# Patient Record
Sex: Male | Born: 1980 | Race: White | Hispanic: No | Marital: Married | Smoking: Current some day smoker
Health system: Southern US, Community
[De-identification: ages and names within clinical notes are randomized; demographics above are authoritative.]

## PROBLEM LIST (undated history)

## (undated) DIAGNOSIS — T7840XA Allergy, unspecified, initial encounter: Secondary | ICD-10-CM

## (undated) HISTORY — DX: Allergy, unspecified, initial encounter: T78.40XA

---

## 2007-11-03 ENCOUNTER — Ambulatory Visit: Payer: Self-pay | Admitting: Internal Medicine

## 2007-11-03 LAB — CONVERTED CEMR LAB
Bilirubin Urine: NEGATIVE
Glucose, Urine, Semiquant: NEGATIVE
Protein, U semiquant: NEGATIVE
Urobilinogen, UA: NEGATIVE
WBC Urine, dipstick: NEGATIVE

## 2007-11-05 LAB — CONVERTED CEMR LAB
ALT: 16 units/L (ref 0–53)
AST: 22 units/L (ref 0–37)
BUN: 17 mg/dL (ref 6–23)
Calcium: 9.7 mg/dL (ref 8.4–10.5)
Chloride: 103 meq/L (ref 96–112)
Cholesterol: 180 mg/dL (ref 0–200)
GFR calc Af Amer: 105 mL/min
GFR calc non Af Amer: 87 mL/min
LDL Cholesterol: 109 mg/dL — ABNORMAL HIGH (ref 0–99)
TSH: 1.24 microintl units/mL (ref 0.35–5.50)
Total CHOL/HDL Ratio: 3.7
VLDL: 22 mg/dL (ref 0–40)

## 2007-11-11 ENCOUNTER — Telehealth: Payer: Self-pay | Admitting: Internal Medicine

## 2007-11-16 ENCOUNTER — Encounter (INDEPENDENT_AMBULATORY_CARE_PROVIDER_SITE_OTHER): Payer: Self-pay | Admitting: *Deleted

## 2009-02-21 ENCOUNTER — Emergency Department (HOSPITAL_COMMUNITY): Admission: EM | Admit: 2009-02-21 | Discharge: 2009-02-21 | Payer: Self-pay | Admitting: Emergency Medicine

## 2009-02-23 ENCOUNTER — Encounter: Payer: Self-pay | Admitting: Internal Medicine

## 2009-03-02 ENCOUNTER — Ambulatory Visit (HOSPITAL_COMMUNITY): Admission: RE | Admit: 2009-03-02 | Discharge: 2009-03-03 | Payer: Self-pay | Admitting: Orthopedic Surgery

## 2009-03-02 HISTORY — PX: ORIF CLAVICLE FRACTURE: SUR924

## 2011-02-06 ENCOUNTER — Telehealth: Payer: Self-pay | Admitting: Internal Medicine

## 2011-02-12 NOTE — Progress Notes (Signed)
Summary: Paperwork --Needs CPX  Phone Note Other Incoming   Summary of Call: The office received Conceal and Carry paperwork from [facility]. Per MD this cannot be done because the patient has not been seen since 2008. Called patient  to discuss, left message on machine to call back to office. Lucious Groves CMA  February 06, 2011 3:51 PM  Initial call taken by: Lucious Groves CMA,  February 06, 2011 3:50 PM  Follow-up for Phone Call        Patient notified and will come in for CPX. Lucious Groves CMA  February 07, 2011 8:35 AM

## 2011-02-21 ENCOUNTER — Encounter (INDEPENDENT_AMBULATORY_CARE_PROVIDER_SITE_OTHER): Payer: BC Managed Care – PPO | Admitting: Internal Medicine

## 2011-02-21 ENCOUNTER — Encounter: Payer: Self-pay | Admitting: Internal Medicine

## 2011-02-21 DIAGNOSIS — M549 Dorsalgia, unspecified: Secondary | ICD-10-CM | POA: Insufficient documentation

## 2011-02-21 DIAGNOSIS — Z0000 Encounter for general adult medical examination without abnormal findings: Secondary | ICD-10-CM

## 2011-02-21 DIAGNOSIS — Z23 Encounter for immunization: Secondary | ICD-10-CM

## 2011-02-26 LAB — CONVERTED CEMR LAB
Alkaline Phosphatase: 63 units/L (ref 39–117)
BUN: 15 mg/dL (ref 6–23)
Basophils Absolute: 0 10*3/uL (ref 0.0–0.1)
Bilirubin, Direct: 0.2 mg/dL (ref 0.0–0.3)
Cholesterol: 159 mg/dL (ref 0–200)
Creatinine, Ser: 0.99 mg/dL (ref 0.40–1.50)
Eosinophils Relative: 2 % (ref 0–5)
Glucose, Bld: 73 mg/dL (ref 70–99)
HCT: 39.9 % (ref 39.0–52.0)
Indirect Bilirubin: 0.9 mg/dL (ref 0.0–0.9)
LDL Cholesterol: 82 mg/dL (ref 0–99)
Lymphocytes Relative: 35 % (ref 12–46)
Lymphs Abs: 2.3 10*3/uL (ref 0.7–4.0)
Neutro Abs: 3.7 10*3/uL (ref 1.7–7.7)
Neutrophils Relative %: 56 % (ref 43–77)
Platelets: 191 10*3/uL (ref 150–400)
Potassium: 3.7 meq/L (ref 3.5–5.3)
RDW: 12.8 % (ref 11.5–15.5)
Sodium: 139 meq/L (ref 135–145)
Total Protein: 6.8 g/dL (ref 6.0–8.3)
Triglycerides: 113 mg/dL (ref <150)
WBC: 6.6 10*3/uL (ref 4.0–10.5)
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
## 2011-04-03 LAB — PROTIME-INR
INR: 1 (ref 0.00–1.49)
Prothrombin Time: 12.9 seconds (ref 11.6–15.2)

## 2011-04-03 LAB — COMPREHENSIVE METABOLIC PANEL
BUN: 17 mg/dL (ref 6–23)
CO2: 28 mEq/L (ref 19–32)
Calcium: 9.8 mg/dL (ref 8.4–10.5)
Creatinine, Ser: 0.94 mg/dL (ref 0.4–1.5)
GFR calc non Af Amer: >60 mL/min (ref >60)
Glucose, Bld: 99 mg/dL (ref 70–99)
Sodium: 140 mEq/L (ref 135–145)
Total Protein: 6.9 g/dL (ref 6.0–8.3)

## 2011-04-03 LAB — APTT: aPTT: 28 seconds (ref 24–37)

## 2011-04-03 LAB — CBC
Hemoglobin: 14.8 g/dL (ref 13.0–17.0)
MCHC: 35.9 g/dL (ref 30.0–36.0)
MCV: 90.3 fL (ref 78.0–100.0)
RDW: 12.7 % (ref 11.5–15.5)
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
## 2011-08-19 ENCOUNTER — Ambulatory Visit: Payer: BC Managed Care – PPO | Admitting: Internal Medicine

## 2015-10-23 HISTORY — PX: VASECTOMY: SHX75

## 2015-11-21 ENCOUNTER — Emergency Department (HOSPITAL_COMMUNITY)
Admission: EM | Admit: 2015-11-21 | Discharge: 2015-11-21 | Disposition: A | Discharge status: Home / Self Care | Payer: 59 | Source: Home / Self Care | Attending: Family Medicine | Admitting: Family Medicine

## 2015-11-21 ENCOUNTER — Encounter (HOSPITAL_COMMUNITY): Payer: Self-pay | Admitting: *Deleted

## 2015-11-21 ENCOUNTER — Emergency Department (INDEPENDENT_AMBULATORY_CARE_PROVIDER_SITE_OTHER): Payer: 59

## 2015-11-21 DIAGNOSIS — J069 Acute upper respiratory infection, unspecified: Secondary | ICD-10-CM | POA: Diagnosis not present

## 2015-11-21 MED ORDER — AZITHROMYCIN 250 MG PO TABS: ORAL_TABLET | ORAL | Status: DC

## 2015-11-21 MED ORDER — IPRATROPIUM BROMIDE 0.06 % NA SOLN: 2.0000 | Freq: Four times a day (QID) | NASAL | Status: DC

## 2015-11-21 MED ORDER — GUAIFENESIN-CODEINE 100-10 MG/5ML PO SYRP: 10.0000 mL | ORAL_SOLUTION | Freq: Four times a day (QID) | ORAL | Status: DC | PRN

## 2015-11-21 NOTE — ED Provider Notes (Signed)
CSN: [id number]     Arrival date & time 11/21/15  1741 History   First MD Initiated Contact with Patient 11/21/15 1823     Chief Complaint  Patient presents with  . URI   (Consider location/radiation/quality/duration/timing/severity/associated sxs/prior Treatment) Patient is a 34 y.o. male presenting with URI. The history is provided by the patient.  URI Presenting symptoms: congestion, cough, fever and rhinorrhea   Severity:  Moderate Onset quality:  Gradual Duration:  2 weeks Associated symptoms comment:  Phlegm esp in am. Risk factors: sick contacts   Risk factors comment:  Wife also with pna and child ill.   History reviewed. No pertinent past medical history. History reviewed. No pertinent past surgical history. History reviewed. No pertinent family history. Social History  Substance Use Topics  . Smoking status: None  . Smokeless tobacco: None  . Alcohol Use: No    Review of Systems  Constitutional: Positive for fever.  HENT: Positive for congestion and rhinorrhea.   Respiratory: Positive for cough.   Cardiovascular: Negative for palpitations and leg swelling.  All other systems reviewed and are negative.   Allergies  Review of patient's allergies indicates no known allergies.  Home Medications   Prior to Admission medications   Medication Sig Start Date End Date Taking? Authorizing Provider  azithromycin (ZITHROMAX Z-PAK) 250 MG tablet Take as directed on pack 11/21/15   Linna Hoff, MD  guaiFENesin-codeine [facility]) 100-10 MG/5ML syrup Take 10 mLs by mouth 4 (four) times daily as needed for cough. 11/21/15   Linna Hoff, MD  ipratropium (ATROVENT) 0.06 % nasal spray Place 2 sprays into both nostrils 4 (four) times daily. 11/21/15   Linna Hoff, MD   Meds Ordered and Administered this Visit  Medications - No data to display  BP 130/74 mmHg  Pulse 78  Temp(Src) 98.6 F (37 C) (Oral)  Resp 18  SpO2 100% No data found.   Physical Exam   Constitutional: He is oriented to person, place, and time. He appears well-developed and well-nourished. No distress.  HENT:  Head: Normocephalic.  Right Ear: External ear normal.  Left Ear: External ear normal.  Mouth/Throat: Oropharynx is clear and moist.  Eyes: Conjunctivae are normal. Pupils are equal, round, and reactive to light.  Neck: Normal range of motion. Neck supple.  Cardiovascular: Regular rhythm, normal heart sounds and intact distal pulses.   Pulmonary/Chest: Effort normal and breath sounds normal.  Lymphadenopathy:    He has no cervical adenopathy.  Neurological: He is alert and oriented to person, place, and time.  Skin: Skin is warm and dry.  Nursing note and vitals reviewed.   ED Course  Procedures (including critical care time)  Labs Review Labs Reviewed - No data to display  Imaging Review Dg Chest 2 View  11/21/2015  CLINICAL DATA:  Productive cough, congestion, sore throat EXAM: CHEST  2 VIEW COMPARISON:  None. FINDINGS: There is no focal parenchymal opacity. There is no pleural effusion or pneumothorax. The heart and mediastinal contours are unremarkable. There is a right midclavicular malleable plate with multiple interlocking screws transfixing a healed fracture. IMPRESSION: No active cardiopulmonary disease. Electronically Signed   By: Elige Ko   On: 11/21/2015 18:52   X-rays reviewed and report per radiologist.   Visual Acuity Review  Right Eye Distance:   Left Eye Distance:   Bilateral Distance:    Right Eye Near:   Left Eye Near:    Bilateral Near:  MDM   1. URI (upper respiratory infection)        Linna HoffJames D Kindl, MD 11/21/15 [phone]

## 2015-11-21 NOTE — Discharge Instructions (Signed)
Drink plenty of fluids as discussed, use medicine as prescribed, and mucinex or delsym for cough. Return or see your doctor if further problems °

## 2015-11-21 NOTE — ED Notes (Signed)
Pt  Reports           Wife   Has  pnuemonia       -  Pt  States      Cough  /  Congested     Symptoms  X  10  Days            Son   Has     Eye  Infection  l   Eye slightly  Puffy      Pt      Reports   Symptoms   Not  releived  By  [facility]  meds

## 2016-01-04 DIAGNOSIS — Z0000 Encounter for general adult medical examination without abnormal findings: Secondary | ICD-10-CM | POA: Diagnosis not present

## 2016-04-30 DIAGNOSIS — H5213 Myopia, bilateral: Secondary | ICD-10-CM | POA: Diagnosis not present

## 2016-06-30 DIAGNOSIS — H81399 Other peripheral vertigo, unspecified ear: Secondary | ICD-10-CM | POA: Diagnosis not present

## 2018-04-05 ENCOUNTER — Telehealth: Payer: Self-pay

## 2018-04-05 NOTE — Telephone Encounter (Signed)
Please schedule re-establish appt at Pt's convenience. Thank you.

## 2018-04-05 NOTE — Telephone Encounter (Signed)
Please advise 

## 2018-04-05 NOTE — Telephone Encounter (Signed)
Copied from CRM [phone]. Topic: Appointment Scheduling - Prior Auth Required for Appointment >> Apr 05, 2018 10:09 AM Terisa Starraylor, Brittany L wrote: Patient wants to re - est care with Dr Drue NovelPaz. I am not showing where he has seen Dr Drue NovelPaz at the [place] location. Please advise , call patient @ [phone].

## 2018-04-05 NOTE — Telephone Encounter (Signed)
Pt needed a cpe before June kset up cpe/reestablish care appt 05/18/18 @ 3pm.

## 2018-04-05 NOTE — Telephone Encounter (Signed)
Yes, please schedule a visit at his convenience, okay to do a new pt CPX

## 2018-04-05 NOTE — Telephone Encounter (Signed)
Noted  

## 2018-05-18 ENCOUNTER — Encounter: Payer: Self-pay | Admitting: Internal Medicine

## 2018-05-18 ENCOUNTER — Ambulatory Visit (INDEPENDENT_AMBULATORY_CARE_PROVIDER_SITE_OTHER): Payer: 59 | Admitting: Internal Medicine

## 2018-05-18 VITALS — BP 124/76 | HR 71 | Temp 98.1°F | Resp 16 | Ht 76.0 in | Wt 297.1 lb

## 2018-05-18 DIAGNOSIS — Z114 Encounter for screening for human immunodeficiency virus [HIV]: Secondary | ICD-10-CM

## 2018-05-18 DIAGNOSIS — Z0000 Encounter for general adult medical examination without abnormal findings: Secondary | ICD-10-CM | POA: Diagnosis not present

## 2018-05-18 NOTE — Progress Notes (Signed)
Subjective:    Patient ID: Wayne Hart, male    DOB: 11-17-1981, 37 y.o.   MRN: [id number]  DOS:  05/18/2018 Type of visit - description : CPX Interval history: Last visit was few years ago, since then he is doing well.  No major concerns, request a physical   Review of Systems  A 14 point review of systems is negative    Past Medical History:  Diagnosis Date  . Allergy    seasonal    Past Surgical History:  Procedure Laterality Date  . FRACTURE SURGERY Right 2010   clavicle fx  . VASECTOMY  10/2015    Social History   Socioeconomic History  . Marital status: Married    Spouse name: Not on file  . Number of children: 2  . Years of education: Not on file  . Highest education level: Not on file  Occupational History  . 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Banker  Social Needs  . Financial resource strain: Not on file  . Food insecurity:    Worry: Not on file    Inability: Not on file  . Transportation needs:    Medical: Not on file    Non-medical: Not on file  Tobacco Use  . Smoking status: Current Some Day Smoker    Types: Cigars  . Smokeless tobacco: Never Used  . Tobacco comment: occ cigars   Substance and Sexual Activity  . Alcohol use: No    Comment: socially   . Drug use: Not on file  . Sexual activity: Not on file  Lifestyle  . Physical activity:    Days per week: Not on file    Minutes per session: Not on file  . Stress: Not on file  Relationships  . Social connections:    Talks on phone: Not on file    Gets together: Not on file    Attends religious service: Not on file    Active member of club or organization: Not on file    Attends meetings of clubs or organizations: Not on file    Relationship status: Not on file  . Intimate partner violence:    Fear of current or ex partner: Not on file    Emotionally abused: Not on file    Physically abused: Not on file    Forced sexual activity: Not on file  Other Topics Concern  . Not on file    Social History Narrative  . Not on file     Family History  Problem Relation Age of Onset  . Myelodysplastic syndrome Father 80  . Diabetes Maternal Grandmother   . Colon cancer Neg Hx   . Prostate cancer Neg Hx   . CAD Neg Hx      Allergies as of 05/18/2018   No Known Allergies     Medication List    as of 05/18/2018 11:59 PM   You have not been prescribed any medications.        Objective:   Physical Exam BP 124/76 (BP Location: Left Arm, Patient Position: Sitting, Cuff Size: Small)   Pulse 71   Temp 98.1 F (36.7 C) (Oral)   Resp 16   Ht  (1.93 m)   Wt 297 lb 2 oz (134.8 kg)   SpO2 97%   BMI 36.17 kg/m  General:   Well developed, well nourished . NAD.  Neck: No  thyromegaly  HEENT:  Normocephalic . Face symmetric, atraumatic Lungs:  CTA B Normal respiratory effort,  no intercostal retractions, no accessory muscle use. Heart: RRR,  no murmur.  No pretibial edema bilaterally  Abdomen:  Not distended, soft, non-tender. No rebound or rigidity.   Skin: Exposed areas without rash. Not pale. Not jaundice Neurologic:  alert & oriented X3.  Speech normal, gait appropriate for age and unassisted Strength symmetric and appropriate for age.  Psych: Cognition and judgment appear intact.  Cooperative with normal attention span and concentration.  Behavior appropriate. No anxious or depressed appearing.     Assessment & Plan:   Assessment: Healthy 37 year old gentleman  Plan: Here for CPX, doing well, RTC 1 year

## 2018-05-18 NOTE — Assessment & Plan Note (Addendum)
Td 2012 No family history of prostate or colon cancer Diet and exercise: He is doing great with exercise, very active, plays racquetball and bicycling 3-4 times a week.  Room for improvement on diet.  Encourage calorie counting.  he would like to get down to 270 pounds. Labs: Will come back fasting for CMP, FLP, CBC, TSH, HIV.

## 2018-05-18 NOTE — Progress Notes (Signed)
Pre visit review using our clinic review tool, if applicable. No additional management support is needed unless otherwise documented below in the visit note. 

## 2018-05-18 NOTE — Patient Instructions (Addendum)
   GO TO THE FRONT DESK Schedule labs to be done within the next few days, fasting  Schedule your next appointment for a physical  exam in 1 year

## 2018-05-19 ENCOUNTER — Other Ambulatory Visit: Payer: 59

## 2018-05-20 ENCOUNTER — Other Ambulatory Visit (INDEPENDENT_AMBULATORY_CARE_PROVIDER_SITE_OTHER): Payer: 59

## 2018-05-20 DIAGNOSIS — Z0000 Encounter for general adult medical examination without abnormal findings: Secondary | ICD-10-CM

## 2018-05-20 DIAGNOSIS — Z114 Encounter for screening for human immunodeficiency virus [HIV]: Secondary | ICD-10-CM

## 2018-05-20 LAB — CBC WITH DIFFERENTIAL/PLATELET
Basophils Absolute: 0 10*3/uL (ref 0.0–0.1)
Basophils Relative: 0.4 % (ref 0.0–3.0)
EOS ABS: 0.1 10*3/uL (ref 0.0–0.7)
Eosinophils Relative: 2.4 % (ref 0.0–5.0)
HCT: 43.7 % (ref 39.0–52.0)
Hemoglobin: 15.5 g/dL (ref 13.0–17.0)
LYMPHS ABS: 1.6 10*3/uL (ref 0.7–4.0)
Lymphocytes Relative: 28.2 % (ref 12.0–46.0)
MCHC: 35.5 g/dL (ref 30.0–36.0)
MCV: 89.8 fl (ref 78.0–100.0)
MONO ABS: 0.4 10*3/uL (ref 0.1–1.0)
Monocytes Relative: 6.8 % (ref 3.0–12.0)
NEUTROS PCT: 62.2 % (ref 43.0–77.0)
Neutro Abs: 3.5 10*3/uL (ref 1.4–7.7)
Platelets: 216 10*3/uL (ref 150.0–400.0)
RBC: 4.86 Mil/uL (ref 4.22–5.81)
RDW: 13.2 % (ref 11.5–15.5)
WBC: 5.7 10*3/uL (ref 4.0–10.5)

## 2018-05-20 LAB — LIPID PANEL
CHOL/HDL RATIO: 4
CHOLESTEROL: 172 mg/dL (ref 0–200)
HDL: 47.9 mg/dL (ref >39.00)
NonHDL: 124.48
TRIGLYCERIDES: 212 mg/dL — AB (ref 0.0–149.0)
VLDL: 42.4 mg/dL — AB (ref 0.0–40.0)

## 2018-05-20 LAB — TSH: TSH: 1.39 u[IU]/mL (ref 0.35–4.50)

## 2018-05-20 LAB — COMPREHENSIVE METABOLIC PANEL
ALBUMIN: 4.3 g/dL (ref 3.5–5.2)
ALT: 20 U/L (ref 0–53)
AST: 16 U/L (ref 0–37)
Alkaline Phosphatase: 67 U/L (ref 39–117)
BUN: 14 mg/dL (ref 6–23)
CALCIUM: 9.3 mg/dL (ref 8.4–10.5)
CO2: 27 meq/L (ref 19–32)
Chloride: 102 mEq/L (ref 96–112)
Creatinine, Ser: 1.08 mg/dL (ref 0.40–1.50)
GFR: 82.25 mL/min (ref >60.00)
Glucose, Bld: 98 mg/dL (ref 70–99)
Potassium: 4.2 mEq/L (ref 3.5–5.1)
Sodium: 136 mEq/L (ref 135–145)
Total Bilirubin: 0.6 mg/dL (ref 0.2–1.2)
Total Protein: 7.1 g/dL (ref 6.0–8.3)

## 2018-05-20 LAB — LDL CHOLESTEROL, DIRECT: LDL DIRECT: 96 mg/dL

## 2018-05-21 LAB — HIV ANTIBODY (ROUTINE TESTING W REFLEX): HIV: NONREACTIVE

## 2018-05-24 ENCOUNTER — Encounter: Payer: Self-pay | Admitting: Internal Medicine

## 2018-07-16 ENCOUNTER — Ambulatory Visit (INDEPENDENT_AMBULATORY_CARE_PROVIDER_SITE_OTHER): Payer: 59 | Admitting: Orthopaedic Surgery

## 2018-07-16 ENCOUNTER — Ambulatory Visit (INDEPENDENT_AMBULATORY_CARE_PROVIDER_SITE_OTHER): Payer: 59

## 2018-07-16 ENCOUNTER — Other Ambulatory Visit (INDEPENDENT_AMBULATORY_CARE_PROVIDER_SITE_OTHER): Payer: Self-pay

## 2018-07-16 DIAGNOSIS — M25561 Pain in right knee: Secondary | ICD-10-CM

## 2018-07-16 NOTE — Progress Notes (Signed)
Office Visit Note   Patient: Wayne Hart           Date of Birth: March 05, 1981           MRN: [id number] Visit Date: 07/16/2018              Requested by: Wayne Hart, Wayne E, MD [address] [place] [place], [place] [id number] PCP: Wayne Hart, Wayne E, MD   Assessment & Plan: Visit Diagnoses:  1. Acute pain of right knee     Plan: Impression is 37 year old gentleman with suspected acute and displaced right medial meniscus tear.  Patient is unable to fully extend or flex his knee.  I am concerned that he may have a bucket-handle tear.  Recommend MRI to fully evaluate the medial meniscus tear.  I will call the patient with the results once they are available.  Follow-Up Instructions: Return if symptoms worsen or fail to improve.   Orders:  Orders Placed This Encounter  Procedures  . XR Knee 1-2 Views Right   No orders of the defined types were placed in this encounter.     Procedures: No procedures performed   Clinical Data: No additional findings.   Subjective: Chief Complaint  Patient presents with  . Right Knee - Pain    Wayne MalkinZach is a very pleasant-year-old gentleman who comes in with 1 month history of right knee pain that he sustained from playing racquetball.  He is married to Wayne MutualHeather Hart who is a Scientist, clinical (histocompatibility and immunogenetics)CRNA at [employer].  He denies any significant swelling.  He is limping and has significant difficulty with just normal ambulation.  He is unable to fully extend or flex his knee.  He states that he had an event in which he noticed pain in his knee during play racquetball.  This has failed to resolve.  He is in significant amount of pain.   Review of Systems  Constitutional: Negative.   All other systems reviewed and are negative.    Objective: Vital Signs: There were no vitals taken for this visit.  Physical Exam  Constitutional: He is oriented to person, place, and time. He appears well-developed and well-nourished.  HENT:  Head: Normocephalic and atraumatic.  Eyes: Pupils are  equal, round, and reactive to light.  Neck: Neck supple.  Pulmonary/Chest: Effort normal.  Abdominal: Soft.  Musculoskeletal: Normal range of motion.  Neurological: He is alert and oriented to person, place, and time.  Skin: Skin is warm.  Psychiatric: He has a normal mood and affect. His behavior is normal. Judgment and thought content normal.  Nursing note and vitals reviewed.   Ortho Exam Right knee exam shows a trace joint effusion.  He has exquisite tenderness along the medial joint line especially in the posterior medial region.  Positive McMurray.  Positive Thessaly.  Collaterals and cruciates are stable.    Specialty Comments:  No specialty comments available.  Imaging: Xr Knee 1-2 Views Right  Result Date: 07/16/2018 No acute or structural abnormalities.    PMFS History: Patient Active Problem List   Diagnosis Date Noted  . Annual physical exam 05/18/2018  . BACK PAIN 02/21/2011   Past Medical History:  Diagnosis Date  . Allergy    seasonal    Family History  Problem Relation Age of Onset  . Myelodysplastic syndrome Father 3855  . Diabetes Maternal Grandmother   . Colon cancer Neg Hx   . Prostate cancer Neg Hx   . CAD Neg Hx     Past  Surgical History:  Procedure Laterality Date  . FRACTURE SURGERY Right 2010   clavicle fx  . VASECTOMY  10/2015   Social History   Occupational History  . 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Banker  Tobacco Use  . Smoking status: Current Some Day Smoker    Types: Cigars  . Smokeless tobacco: Never Used  . Tobacco comment: occ cigars   Substance and Sexual Activity  . Alcohol use: No    Comment: socially   . Drug use: Not on file  . Sexual activity: Not on file

## 2018-07-21 ENCOUNTER — Ambulatory Visit
Admission: RE | Admit: 2018-07-21 | Discharge: 2018-07-21 | Disposition: A | Discharge status: Home / Self Care | Payer: 59 | Source: Ambulatory Visit | Attending: Orthopaedic Surgery | Admitting: Orthopaedic Surgery

## 2018-07-21 DIAGNOSIS — S83231A Complex tear of medial meniscus, current injury, right knee, initial encounter: Secondary | ICD-10-CM | POA: Diagnosis not present

## 2018-07-21 DIAGNOSIS — M25561 Pain in right knee: Secondary | ICD-10-CM

## 2018-07-22 NOTE — Progress Notes (Signed)
Left a surgery sheet for you

## 2018-07-27 ENCOUNTER — Encounter (INDEPENDENT_AMBULATORY_CARE_PROVIDER_SITE_OTHER): Payer: Self-pay | Admitting: Orthopaedic Surgery

## 2018-07-30 ENCOUNTER — Encounter (HOSPITAL_BASED_OUTPATIENT_CLINIC_OR_DEPARTMENT_OTHER): Payer: Self-pay | Admitting: *Deleted

## 2018-07-30 ENCOUNTER — Other Ambulatory Visit: Payer: Self-pay

## 2018-08-06 ENCOUNTER — Encounter (HOSPITAL_BASED_OUTPATIENT_CLINIC_OR_DEPARTMENT_OTHER): Payer: Self-pay | Admitting: *Deleted

## 2018-08-06 ENCOUNTER — Ambulatory Visit (HOSPITAL_BASED_OUTPATIENT_CLINIC_OR_DEPARTMENT_OTHER): Payer: 59 | Admitting: Anesthesiology

## 2018-08-06 ENCOUNTER — Ambulatory Visit (HOSPITAL_BASED_OUTPATIENT_CLINIC_OR_DEPARTMENT_OTHER)
Admission: RE | Admit: 2018-08-06 | Discharge: 2018-08-06 | Disposition: A | Discharge status: Home / Self Care | Payer: 59 | Source: Ambulatory Visit | Attending: Orthopaedic Surgery | Admitting: Orthopaedic Surgery

## 2018-08-06 ENCOUNTER — Encounter (HOSPITAL_BASED_OUTPATIENT_CLINIC_OR_DEPARTMENT_OTHER)
Admission: RE | Disposition: A | Discharge status: Home / Self Care | Payer: Self-pay | Source: Ambulatory Visit | Attending: Orthopaedic Surgery

## 2018-08-06 ENCOUNTER — Other Ambulatory Visit: Payer: Self-pay

## 2018-08-06 ENCOUNTER — Encounter (INDEPENDENT_AMBULATORY_CARE_PROVIDER_SITE_OTHER): Payer: Self-pay | Admitting: Orthopaedic Surgery

## 2018-08-06 DIAGNOSIS — M659 Synovitis and tenosynovitis, unspecified: Secondary | ICD-10-CM | POA: Insufficient documentation

## 2018-08-06 DIAGNOSIS — M7711 Lateral epicondylitis, right elbow: Secondary | ICD-10-CM | POA: Diagnosis not present

## 2018-08-06 DIAGNOSIS — M94261 Chondromalacia, right knee: Secondary | ICD-10-CM | POA: Diagnosis not present

## 2018-08-06 DIAGNOSIS — S83241A Other tear of medial meniscus, current injury, right knee, initial encounter: Secondary | ICD-10-CM | POA: Diagnosis not present

## 2018-08-06 DIAGNOSIS — M6751 Plica syndrome, right knee: Secondary | ICD-10-CM

## 2018-08-06 DIAGNOSIS — F17290 Nicotine dependence, other tobacco product, uncomplicated: Secondary | ICD-10-CM | POA: Insufficient documentation

## 2018-08-06 DIAGNOSIS — S83231A Complex tear of medial meniscus, current injury, right knee, initial encounter: Secondary | ICD-10-CM | POA: Diagnosis not present

## 2018-08-06 DIAGNOSIS — X58XXXA Exposure to other specified factors, initial encounter: Secondary | ICD-10-CM | POA: Insufficient documentation

## 2018-08-06 HISTORY — PX: KNEE ARTHROSCOPY WITH MEDIAL MENISECTOMY: SHX5651

## 2018-08-06 HISTORY — PX: KNEE ARTHROSCOPY WITH EXCISION PLICA: SHX5647

## 2018-08-06 SURGERY — ARTHROSCOPY, KNEE, WITH MEDIAL MENISCECTOMY
Anesthesia: General | Site: Knee | Laterality: Right

## 2018-08-06 MED ORDER — LIDOCAINE 2% (20 MG/ML) 5 ML SYRINGE
INTRAMUSCULAR | Status: AC
Start: 2018-08-06 — End: ?
  Filled 2018-08-06: qty 5

## 2018-08-06 MED ORDER — LACTATED RINGERS IV SOLN
INTRAVENOUS | Status: DC
  Administered 2018-08-06: 11:00:00 via INTRAVENOUS

## 2018-08-06 MED ORDER — DEXAMETHASONE SODIUM PHOSPHATE 10 MG/ML IJ SOLN
INTRAMUSCULAR | Status: AC
  Filled 2018-08-06: qty 1

## 2018-08-06 MED ORDER — CHLORHEXIDINE GLUCONATE 4 % EX LIQD: 60.0000 mL | Freq: Once | CUTANEOUS | Status: DC

## 2018-08-06 MED ORDER — OXYCODONE HCL 5 MG PO TABS: 5.0000 mg | ORAL_TABLET | Freq: Once | ORAL | Status: DC | PRN

## 2018-08-06 MED ORDER — MIDAZOLAM HCL 2 MG/2ML IJ SOLN
INTRAMUSCULAR | Status: AC
  Filled 2018-08-06: qty 2

## 2018-08-06 MED ORDER — FENTANYL CITRATE (PF) 100 MCG/2ML IJ SOLN
INTRAMUSCULAR | Status: AC
  Filled 2018-08-06: qty 2

## 2018-08-06 MED ORDER — PROPOFOL 10 MG/ML IV BOLUS
INTRAVENOUS | Status: DC | PRN
  Administered 2018-08-06: 280 mg via INTRAVENOUS
  Administered 2018-08-06: 40 mg via INTRAVENOUS

## 2018-08-06 MED ORDER — CEFAZOLIN SODIUM-DEXTROSE 2-4 GM/100ML-% IV SOLN
INTRAVENOUS | Status: AC
  Filled 2018-08-06: qty 100

## 2018-08-06 MED ORDER — LACTATED RINGERS IV SOLN: INTRAVENOUS | Status: DC

## 2018-08-06 MED ORDER — FENTANYL CITRATE (PF) 100 MCG/2ML IJ SOLN
50.0000 ug | INTRAMUSCULAR | Status: DC | PRN
  Administered 2018-08-06: 50 ug via INTRAVENOUS
  Administered 2018-08-06: 100 ug via INTRAVENOUS

## 2018-08-06 MED ORDER — LIDOCAINE HCL (PF) 1 % IJ SOLN
INTRAMUSCULAR | Status: AC
  Filled 2018-08-06: qty 5

## 2018-08-06 MED ORDER — HYDROMORPHONE HCL 1 MG/ML IJ SOLN: 0.2500 mg | INTRAMUSCULAR | Status: DC | PRN

## 2018-08-06 MED ORDER — ONDANSETRON HCL 4 MG/2ML IJ SOLN
INTRAMUSCULAR | Status: DC | PRN
  Administered 2018-08-06: 4 mg via INTRAVENOUS

## 2018-08-06 MED ORDER — LIDOCAINE 2% (20 MG/ML) 5 ML SYRINGE
INTRAMUSCULAR | Status: DC | PRN
  Administered 2018-08-06: 50 mg via INTRAVENOUS

## 2018-08-06 MED ORDER — OXYCODONE HCL 5 MG/5ML PO SOLN: 5.0000 mg | Freq: Once | ORAL | Status: DC | PRN

## 2018-08-06 MED ORDER — MEPERIDINE HCL 25 MG/ML IJ SOLN: 6.2500 mg | INTRAMUSCULAR | Status: DC | PRN

## 2018-08-06 MED ORDER — METHYLPREDNISOLONE ACETATE 40 MG/ML IJ SUSP
INTRAMUSCULAR | Status: AC
  Filled 2018-08-06: qty 1

## 2018-08-06 MED ORDER — CEFAZOLIN SODIUM-DEXTROSE 2-4 GM/100ML-% IV SOLN
2.0000 g | INTRAVENOUS | Status: AC
  Administered 2018-08-06: 2 g via INTRAVENOUS

## 2018-08-06 MED ORDER — PROMETHAZINE HCL 25 MG/ML IJ SOLN: 6.2500 mg | INTRAMUSCULAR | Status: DC | PRN

## 2018-08-06 MED ORDER — HYDROCODONE-ACETAMINOPHEN 5-325 MG PO TABS: 1.0000 | ORAL_TABLET | Freq: Four times a day (QID) | ORAL | 0 refills | Status: DC | PRN

## 2018-08-06 MED ORDER — DEXAMETHASONE SODIUM PHOSPHATE 4 MG/ML IJ SOLN
INTRAMUSCULAR | Status: DC | PRN
  Administered 2018-08-06 (×2): 10 mg via INTRAVENOUS

## 2018-08-06 MED ORDER — BUPIVACAINE HCL (PF) 0.25 % IJ SOLN
INTRAMUSCULAR | Status: AC
  Filled 2018-08-06: qty 30

## 2018-08-06 MED ORDER — MIDAZOLAM HCL 2 MG/2ML IJ SOLN
1.0000 mg | INTRAMUSCULAR | Status: DC | PRN
  Administered 2018-08-06: 2 mg via INTRAVENOUS

## 2018-08-06 MED ORDER — BUPIVACAINE HCL (PF) 0.25 % IJ SOLN
INTRAMUSCULAR | Status: DC | PRN
  Administered 2018-08-06: 20 mL

## 2018-08-06 MED ORDER — SCOPOLAMINE 1 MG/3DAYS TD PT72: 1.0000 | MEDICATED_PATCH | Freq: Once | TRANSDERMAL | Status: DC | PRN

## 2018-08-06 MED ORDER — ONDANSETRON HCL 4 MG/2ML IJ SOLN
INTRAMUSCULAR | Status: AC
  Filled 2018-08-06: qty 2

## 2018-08-06 MED ORDER — ONDANSETRON HCL 4 MG PO TABS: 4.0000 mg | ORAL_TABLET | Freq: Three times a day (TID) | ORAL | 0 refills | Status: DC | PRN

## 2018-08-06 MED ORDER — KETOROLAC TROMETHAMINE 30 MG/ML IJ SOLN
INTRAMUSCULAR | Status: AC
  Filled 2018-08-06: qty 1

## 2018-08-06 MED ORDER — KETOROLAC TROMETHAMINE 30 MG/ML IJ SOLN
INTRAMUSCULAR | Status: DC | PRN
  Administered 2018-08-06: 30 mg via INTRAVENOUS

## 2018-08-06 SURGICAL SUPPLY — 43 items
BANDAGE ACE 6X5 VEL STRL LF (GAUZE/BANDAGES/DRESSINGS) ×6 IMPLANT
BANDAGE ESMARK 6X9 LF (GAUZE/BANDAGES/DRESSINGS) IMPLANT
BLADE 4.2CUDA (BLADE) ×3 IMPLANT
BLADE CUDA GRT WHITE 3.5 (BLADE) IMPLANT
BLADE CUDA SHAVER 3.5 (BLADE) IMPLANT
BLADE CUTTER GATOR 3.5 (BLADE) IMPLANT
BLADE GREAT WHITE 4.2 (BLADE) IMPLANT
BLADE GREAT WHITE 4.2MM (BLADE)
BNDG ESMARK 6X9 LF (GAUZE/BANDAGES/DRESSINGS)
CUFF TOURNIQUET SINGLE 34IN LL (TOURNIQUET CUFF) ×3 IMPLANT
DRAPE ARTHROSCOPY W/POUCH 90 (DRAPES) ×3 IMPLANT
DRAPE IMP U-DRAPE 54X76 (DRAPES) ×3 IMPLANT
DRAPE U-SHAPE 47X51 STRL (DRAPES) ×3 IMPLANT
DURAPREP 26ML APPLICATOR (WOUND CARE) ×3 IMPLANT
ELECT MENISCUS 165MM 90D (ELECTRODE) IMPLANT
ELECT REM PT RETURN 9FT ADLT (ELECTROSURGICAL)
ELECTRODE REM PT RTRN 9FT ADLT (ELECTROSURGICAL) IMPLANT
GAUZE SPONGE 4X4 12PLY STRL (GAUZE/BANDAGES/DRESSINGS) ×3 IMPLANT
GAUZE XEROFORM 1X8 LF (GAUZE/BANDAGES/DRESSINGS) ×3 IMPLANT
GLOVE BIOGEL PI IND STRL 7.0 (GLOVE) ×1 IMPLANT
GLOVE BIOGEL PI INDICATOR 7.0 (GLOVE) ×2
GLOVE ECLIPSE 7.0 STRL STRAW (GLOVE) ×3 IMPLANT
GLOVE SKINSENSE NS SZ7.5 (GLOVE) ×2
GLOVE SKINSENSE STRL SZ7.5 (GLOVE) ×1 IMPLANT
GLOVE SURG SYN 7.5  E (GLOVE) ×2
GLOVE SURG SYN 7.5 E (GLOVE) ×1 IMPLANT
GOWN STRL REIN XL XLG (GOWN DISPOSABLE) ×3 IMPLANT
GOWN STRL REUS W/ TWL LRG LVL3 (GOWN DISPOSABLE) ×1 IMPLANT
GOWN STRL REUS W/ TWL XL LVL3 (GOWN DISPOSABLE) ×1 IMPLANT
GOWN STRL REUS W/TWL LRG LVL3 (GOWN DISPOSABLE) ×2
GOWN STRL REUS W/TWL XL LVL3 (GOWN DISPOSABLE) ×2
KNEE WRAP E Z 3 GEL PACK (MISCELLANEOUS) ×3 IMPLANT
MANIFOLD NEPTUNE II (INSTRUMENTS) ×3 IMPLANT
PACK ARTHROSCOPY DSU (CUSTOM PROCEDURE TRAY) ×3 IMPLANT
PACK BASIN DAY SURGERY FS (CUSTOM PROCEDURE TRAY) ×3 IMPLANT
PENCIL BUTTON HOLSTER BLD 10FT (ELECTRODE) IMPLANT
RESECTOR FULL RADIUS 4.2MM (BLADE) IMPLANT
SHAVER 4.2 MM LANZA 9391A (BLADE) IMPLANT
SUT ETHILON 3 0 PS 1 (SUTURE) ×3 IMPLANT
TOWEL GREEN STERILE FF (TOWEL DISPOSABLE) ×3 IMPLANT
TOWEL OR NON WOVEN STRL DISP B (DISPOSABLE) IMPLANT
TUBING ARTHRO INFLOW-ONLY STRL (TUBING) ×3 IMPLANT
WATER STERILE IRR 1000ML POUR (IV SOLUTION) ×3 IMPLANT

## 2018-08-06 NOTE — Anesthesia Procedure Notes (Signed)
Procedure Name: LMA Insertion Performed by: Friend Dorfman W, CRNA Pre-anesthesia Checklist: Patient identified, Emergency Drugs available, Suction available and Patient being monitored Patient Re-evaluated:Patient Re-evaluated prior to induction Oxygen Delivery Method: Circle system utilized Preoxygenation: Pre-oxygenation with 100% oxygen Induction Type: IV induction Ventilation: Mask ventilation without difficulty LMA: LMA inserted LMA Size: 5.0 Number of attempts: 1 Placement Confirmation: positive ETCO2 Tube secured with: Tape Dental Injury: Teeth and Oropharynx as per pre-operative assessment        

## 2018-08-06 NOTE — Op Note (Signed)
   Surgery Date: 08/06/2018  Surgeon(s): Tarry KosXu, Annah Jasko M, MD  ASSIST: Oneal GroutMary Lindsey Stanbery, PA-C; necessary for the timely completion of procedure and due to complexity of procedure.  ANESTHESIA:  general  FLUIDS: Per anesthesia record.   ESTIMATED BLOOD LOSS: minimal  PREOPERATIVE DIAGNOSES:  1. Right knee medial meniscus tear 2. Right knee medial plica 3. Right lateral epicondylitis  POSTOPERATIVE DIAGNOSES:  same  PROCEDURES PERFORMED:  1. Right knee arthroscopy with medial plica resection 2. Right knee arthroscopy with arthroscopic partial medial meniscectomy 3. Right knee arthroscopy with arthroscopic chondroplasty medial femoral condyle  DESCRIPTION OF PROCEDURE: Mr. Nedra HaiLee is a 37 y.o.-year-old male with right knee medial meniscus tear. Plans are to proceed with partial medial meniscectomy and diagnostic arthroscopy with debridement as indicated. Full discussion held regarding risks benefits alternatives and complications related surgical intervention. Conservative care options reviewed. All questions answered.  The patient was identified in the preoperative holding area and the operative extremity was marked. The patient was brought to the operating room and transferred to operating table in a supine position. Satisfactory general anesthesia was induced by anesthesiology.    Standard anterolateral, anteromedial arthroscopy portals were obtained. The anteromedial portal was obtained with a spinal needle for localization under direct visualization with subsequent diagnostic findings.   Incisions were made for the anterior lateral anteromedial arthroscopy portals.  Limited synovectomy was performed to gain visualization.  We first addressed the medial compartment.  While placed in the knee in a valgus position we are able to perform gentle chondroplasty of the medial femoral condyle with a full-radius shaver.  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  We then worked our way to the  back of the medial compartment and a complex horizontal tear of the medial meniscus was encountered.  This was probed to assess the full extent of the tear.  A partial medial meniscectomy was then performed using a combination of oscillating shaver and meniscus basket back to a stable border.  After this was done we then evaluated the femoral notch which exhibited some mild synovitis.  The lateral compartment was unremarkable.  The patellofemoral compartment exhibited a thickened medial plica which was resected using a full-radius shaver.  Excess fluid was then removed from the knee.  The incisions were closed with interrupted nylon sutures.  Sterile dressings were applied.  Patient tolerated the procedure well had no immediate complications.  Suprapatellar pouch and gutters: mild synovitis or debris. Patella chondral surface: Grade 0 Trochlear chondral surface: Grade 0 Patellofemoral tracking: normal Medial meniscus: complex horizontal tear, posterior horn.  Medial femoral condyle flexion bearing surface: Grade 1 Medial femoral condyle extension bearing surface: Grade 1 Medial tibial plateau: Grade 0 Anterior cruciate ligament:stable Posterior cruciate ligament:stable Lateral meniscus: normal.   Lateral femoral condyle flexion bearing surface: Grade 0 Lateral femoral condyle extension bearing surface: Grade 0 Lateral tibial plateau: Grade 0  Under sterile conditions, a tennis elbow injection was performed using 1 cc each of 40 mg depomedrol, 1% lidocaine and 0.25% bupivicaine.  Band aid was applied.  DISPOSITION: The patient was awakened from general anesthetic, extubated, taken to the recovery room in medically stable condition, no apparent complications. The patient may be weightbearing as tolerated to the operative lower extremity.  Range of motion of right knee as tolerated.  Mayra ReelN. Michael Paradise Vensel, MD [facility] [phone] 1:22 PM

## 2018-08-06 NOTE — H&P (Addendum)
PREOPERATIVE H&P  Chief Complaint: right knee medial meniscal tear, right tennis elbow  HPI: Wayne Hart is a 37 y.o. male who presents for surgical treatment of right knee medial meniscal tear.  He denies any changes in medical history.  He has also been c/o right elbow pain related to tennis elbow that has responded to cortisone injection in the past.  He is requesting another injection today.  Past Medical History:  Diagnosis Date  . Allergy    seasonal   Past Surgical History:  Procedure Laterality Date  . ORIF CLAVICLE FRACTURE Right 03/02/2009  . VASECTOMY  10/2015   Social History   Socioeconomic History  . Marital status: Married    Spouse name: Not on file  . Number of children: 2  . Years of education: Not on file  . Highest education level: Not on file  Occupational History  . 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Bankerfood industry  Social Needs  . Financial resource strain: Not on file  . Food insecurity:    Worry: Not on file    Inability: Not on file  . Transportation needs:    Medical: Not on file    Non-medical: Not on file  Tobacco Use  . Smoking status: Current Some Day Smoker    Types: Cigars  . Smokeless tobacco: Never Used  Substance and Sexual Activity  . Alcohol use: Yes    Comment: occasionally  . Drug use: Never  . Sexual activity: Not on file  Lifestyle  . Physical activity:    Days per week: Not on file    Minutes per session: Not on file  . Stress: Not on file  Relationships  . Social connections:    Talks on phone: Not on file    Gets together: Not on file    Attends religious service: Not on file    Active member of club or organization: Not on file    Attends meetings of clubs or organizations: Not on file    Relationship status: Not on file  Other Topics Concern  . Not on file  Social History Narrative  . Not on file   Family History  Problem Relation Age of Onset  . Myelodysplastic syndrome Father [id number]  . Diabetes Maternal  Grandmother    No Known Allergies Prior to Admission medications   Medication Sig Start Date End Date Taking? Authorizing Provider  ibuprofen (ADVIL,MOTRIN) 200 MG tablet Take 200 mg by mouth every 6 (six) hours as needed.   Yes [provider]     Positive ROS: All other systems have been reviewed and were otherwise negative with the exception of those mentioned in the HPI and as above.  Physical Exam: General: Alert, no acute distress Cardiovascular: No pedal edema Respiratory: No cyanosis, no use of accessory musculature GI: abdomen soft Skin: No lesions in the area of chief complaint Neurologic: Sensation intact distally Psychiatric: Patient is competent for consent with normal mood and affect Lymphatic: no lymphedema Right elbow is ttp lateral epicondyle, pain with resisted wrist extension.  MUSCULOSKELETAL: exam stable  Assessment: right knee medial meniscal tear Right tennis elbow  Plan: Plan for Procedure(s): RIGHT KNEE ARTHROSCOPY WITH PARTIAL MEDIAL MENISCECTOMY Right tennis elbow injection  The risks benefits and alternatives were discussed with the patient including but not limited to the risks of nonoperative treatment, versus surgical intervention including infection, bleeding, nerve injury,  blood clots, cardiopulmonary complications, morbidity, mortality, among others, and they were willing to proceed.  Glee ArvinMichael Xu, MD   08/06/2018 7:58 AM

## 2018-08-06 NOTE — Anesthesia Preprocedure Evaluation (Addendum)
Anesthesia Evaluation  Patient identified by MRN, date of birth, ID band Patient awake    Reviewed: Allergy & Precautions, NPO status , Patient's Chart, lab work & pertinent test results  Airway Mallampati: I  TM Distance: >3 FB Neck ROM: Full    Dental  (+) Teeth Intact, Dental Advisory Given   Pulmonary Current Smoker,    breath sounds clear to auscultation       Cardiovascular negative cardio ROS   Rhythm:Regular Rate:Normal     Neuro/Psych negative neurological ROS     GI/Hepatic negative GI ROS, Neg liver ROS,   Endo/Other  negative endocrine ROS  Renal/GU negative Renal ROS     Musculoskeletal negative musculoskeletal ROS (+)   Abdominal Normal abdominal exam  (+)   Peds  Hematology negative hematology ROS (+)   Anesthesia Other Findings   Reproductive/Obstetrics                            Anesthesia Physical Anesthesia Plan  ASA: I  Anesthesia Plan: General   Post-op Pain Management:    Induction: Intravenous  PONV Risk Score and Plan: 2 and Ondansetron, Dexamethasone and Midazolam  Airway Management Planned: LMA  Additional Equipment: None  Intra-op Plan:   Post-operative Plan: Extubation in OR  Informed Consent: I have reviewed the patients History and Physical, chart, labs and discussed the procedure including the risks, benefits and alternatives for the proposed anesthesia with the patient or authorized representative who has indicated his/her understanding and acceptance.   Dental advisory given  Plan Discussed with: CRNA  Anesthesia Plan Comments:        Anesthesia Quick Evaluation

## 2018-08-06 NOTE — Discharge Instructions (Signed)
No Ibuprofen products until after 7:30pm 08/06/18   Post-operative patient instructions  Knee Arthroscopy    Ice:  Place intermittent ice or cooler pack over your knee, 30 minutes on and 30 minutes off.  Continue this for the first 72 hours after surgery, then save ice for use after therapy sessions or on more active days.    Weight:  You may bear weight on your leg as your symptoms allow.  Crutches:  Use crutches (or walker) to assist in walking until told to discontinue by your physical therapist or physician. This will help to reduce pain.  Strengthening:  Perform simple thigh squeezes (isometric quad contractions) and straight leg lifts as you are able (3 sets of 5 to 10 repetitions, 3 times a day).  For the leg lifts, have someone support under your ankle in the beginning until you have increased strength enough to do this on your own.  To help get started on thigh squeezes, place a pillow under your knee and push down on the pillow with back of knee (sometimes easier to do than with your leg fully straight).  Motion:  Perform gentle knee motion as tolerated - this is gentle bending and straightening of the knee. Seated heel slides: you can start by sitting in a chair, remove your brace, and gently slide your heel back on the floor - allowing your knee to bend. Have someone help you straighten your knee (or use your other leg/foot hooked under your ankle.   Dressing:  Perform 1st dressing change at 2 days postoperative. A moderate amount of blood tinged drainage is to be expected.  So if you bleed through the dressing on the first or second day or if you have fevers, it is fine to change the dressing/check the wounds early and redress wound. Elevate your leg.  If it bleeds through again, or if the incisions are leaking frank blood, please call the office. May change dressing every 1-2 days thereafter to help watch wounds. Can purchase Tegaderm (or 32M Nexcare) water resistant dressings at local  pharmacy / [employer].  Shower:  Light shower is ok after 2 days.  Please take shower, NO bath. Recover with gauze and ace wrap to help keep wounds protected.    Pain medication:  A narcotic pain medication has been prescribed.  Take as directed.  Typically you need narcotic pain medication more regularly during the first 3 to 5 days after surgery.  Decrease your use of the medication as the pain improves.  Narcotics can sometimes cause constipation, even after a few doses.  If you have problems with constipation, you can take an over the counter stool softener or light laxative.  If you have persistent problems, please notify your physicians office.  Physical therapy: Additional activity guidelines to be provided by your physician or physical therapist at follow-up visits.   Driving: Do not recommend driving x 2 weeks post surgical, especially if surgery performed on right side. Should not drive while taking narcotic pain medications. It typically takes at least 2 weeks to restore sufficient neuromuscular function for normal reaction times for driving safety.   Call [phone] for questions or problems. Evenings you will be forwarded to the hospital operator.  Ask for the orthopaedic physician on call. Please call if you experience:    o Redness, foul smelling, or persistent drainage from the surgical site  o worsening knee pain and swelling not responsive to medication  o any calf pain and or swelling of the  lower leg  o temperatures greater than 101.5 F o other questions or concerns   Thank you for allowing us to be a part of your care.  Post Anesthesia Home Care Instructions  Activity: Get plenty of rest for the remainder of the day. A responsible individual must stay with you for 24 hours following the procedure.  For the next 24 hours, DO NOT: -Drive a car -Advertising copywriterperate machinery -Drink alcoholic beverages -Take any medication unless instructed by your physician -Make any legal  decisions or sign important papers.  Meals: Start with liquid foods such as gelatin or soup. Progress to regular foods as tolerated. Avoid greasy, spicy, heavy foods. If nausea and/or vomiting occur, drink only clear liquids until the nausea and/or vomiting subsides. Call your physician if vomiting continues.  Special Instructions/Symptoms: Your throat may feel dry or sore from the anesthesia or the breathing tube placed in your throat during surgery. If this causes discomfort, gargle with warm salt water. The discomfort should disappear within 24 hours.  If you had a scopolamine patch placed behind your ear for the management of post- operative nausea and/or vomiting:  1. The medication in the patch is effective for 72 hours, after which it should be removed.  Wrap patch in a tissue and discard in the trash. Wash hands thoroughly with soap and water. 2. You may remove the patch earlier than 72 hours if you experience unpleasant side effects which may include dry mouth, dizziness or visual disturbances. 3. Avoid touching the patch. Wash your hands with soap and water after contact with the patch.

## 2018-08-06 NOTE — Transfer of Care (Signed)
Immediate Anesthesia Transfer of Care Note  Patient: Sander NephewZachary R Wlodarczyk  Procedure(s) Performed: RIGHT KNEE ARTHROSCOPY WITH PARTIAL MEDIAL MENISCECTOMY (Right Knee) KNEE ARTHROSCOPY WITH EXCISION PLICA (Right Knee)  Patient Location: PACU  Anesthesia Type:General  Level of Consciousness: awake and sedated  Airway & Oxygen Therapy: Patient Spontanous Breathing and Patient connected to face mask oxygen  Post-op Assessment: Report given to RN and Post -op Vital signs reviewed and stable  Post vital signs: Reviewed and stable  Last Vitals:  Vitals Value Taken Time  BP    Temp    Pulse 75 08/06/2018  1:30 PM  Resp 16 08/06/2018  1:30 PM  SpO2 97 % 08/06/2018  1:30 PM  Vitals shown include unvalidated device data.  Last Pain:  Vitals:   08/06/18 1035  TempSrc: Oral  PainSc: 0-No pain         Complications: No apparent anesthesia complications

## 2018-08-06 NOTE — Anesthesia Postprocedure Evaluation (Signed)
Anesthesia Post Note  Patient: Wayne Hart  Procedure(s) Performed: RIGHT KNEE ARTHROSCOPY WITH PARTIAL MEDIAL MENISCECTOMY (Right Knee) KNEE ARTHROSCOPY WITH EXCISION PLICA (Right Knee)     Patient location during evaluation: PACU Anesthesia Type: General Level of consciousness: awake and alert Pain management: pain level controlled Vital Signs Assessment: post-procedure vital signs reviewed and stable Respiratory status: spontaneous breathing, nonlabored ventilation, respiratory function stable and patient connected to nasal cannula oxygen Cardiovascular status: blood pressure returned to baseline and stable Postop Assessment: no apparent nausea or vomiting Anesthetic complications: no    Last Vitals:  Vitals:   08/06/18 1400 08/06/18 1430  BP:  134/75  Pulse: 73 65  Resp: 18 18  Temp:  36.6 C  SpO2: 95% 97%    Last Pain:  Vitals:   08/06/18 1345  TempSrc:   PainSc: Asleep                 Shelton SilvasKevin D Tanai Bouler

## 2018-08-09 ENCOUNTER — Encounter (HOSPITAL_BASED_OUTPATIENT_CLINIC_OR_DEPARTMENT_OTHER): Payer: Self-pay | Admitting: Orthopaedic Surgery

## 2018-08-19 ENCOUNTER — Ambulatory Visit (INDEPENDENT_AMBULATORY_CARE_PROVIDER_SITE_OTHER): Payer: 59 | Admitting: Physician Assistant

## 2018-08-19 ENCOUNTER — Encounter (INDEPENDENT_AMBULATORY_CARE_PROVIDER_SITE_OTHER): Payer: Self-pay | Admitting: Physician Assistant

## 2018-08-19 DIAGNOSIS — Z9889 Other specified postprocedural states: Secondary | ICD-10-CM | POA: Insufficient documentation

## 2018-08-19 NOTE — Progress Notes (Signed)
   Post-Op Visit Note   Patient: Wayne Hart           Date of Birth: 04-20-1981           MRN: [id number] Visit Date: 08/19/2018 PCP: Wanda PlumpPaz, Jose E, MD   Assessment & Plan:  Chief Complaint:  Chief Complaint  Patient presents with  . Right Knee - Routine Post Op   Visit Diagnoses:  1. S/P right knee arthroscopy     Plan: Patient is a pleasant 37 year old gentleman who presents to our clinic today 13 days status post right knee arthroscopic debridement medial meniscus and excision of plica, date of surgery 08/06/2018.  He has been doing excellent.  No pain.  No fevers chills or any other systemic symptoms.  Examination of his right knee reveals well-healing surgical portals with nylon sutures in place.  Trace effusion.  Calf is soft and nontender.  He is neurovascularly intact distally.  Today, nylon sutures were removed and Steri-Strips applied.  Range of motion exercises provided to the patient.  He will follow-up with us in 4 weeks time for recheck, however if he is doing well he can call and let us know and cancel the appointment.  Call with concerns or questions in the meantime.  Follow-Up Instructions: Return in about 4 weeks (around 09/16/2018).   Orders:  No orders of the defined types were placed in this encounter.  No orders of the defined types were placed in this encounter.   Imaging: No results found.  PMFS History: Patient Active Problem List   Diagnosis Date Noted  . S/P right knee arthroscopy 08/19/2018  . Acute medial meniscus tear of right knee   . Plica of knee, right   . Annual physical exam 05/18/2018  . BACK PAIN 02/21/2011   Past Medical History:  Diagnosis Date  . Allergy    seasonal    Family History  Problem Relation Age of Onset  . Myelodysplastic syndrome Father 5855  . Diabetes Maternal Grandmother     Past Surgical History:  Procedure Laterality Date  . KNEE ARTHROSCOPY WITH EXCISION PLICA Right 08/06/2018   Procedure: KNEE ARTHROSCOPY  WITH EXCISION PLICA;  Surgeon: Tarry KosXu, Naiping M, MD;  Location: [facility];  Service: Orthopedics;  Laterality: Right;  . KNEE ARTHROSCOPY WITH MEDIAL MENISECTOMY Right 08/06/2018   Procedure: RIGHT KNEE ARTHROSCOPY WITH PARTIAL MEDIAL MENISCECTOMY;  Surgeon: Tarry KosXu, Naiping M, MD;  Location: [facility];  Service: Orthopedics;  Laterality: Right;  injection of tennis elbow right 1cc marcaine .25 1cc depomedrol 40mg  and lidocaine 1 % 1cc  at 1230  . ORIF CLAVICLE FRACTURE Right 03/02/2009  . VASECTOMY  10/2015   Social History   Occupational History  . 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Bankerfood industry  Tobacco Use  . Smoking status: Current Some Day Smoker    Types: Cigars  . Smokeless tobacco: Never Used  Substance and Sexual Activity  . Alcohol use: Yes    Comment: occasionally  . Drug use: Never  . Sexual activity: Not on file

## 2018-09-17 ENCOUNTER — Ambulatory Visit (INDEPENDENT_AMBULATORY_CARE_PROVIDER_SITE_OTHER): Payer: 59 | Admitting: Orthopaedic Surgery

## 2019-05-20 ENCOUNTER — Encounter: Payer: 59 | Admitting: Internal Medicine

## 2019-06-27 DIAGNOSIS — J342 Deviated nasal septum: Secondary | ICD-10-CM | POA: Diagnosis not present

## 2019-06-27 DIAGNOSIS — R040 Epistaxis: Secondary | ICD-10-CM | POA: Insufficient documentation

## 2019-07-21 ENCOUNTER — Encounter: Payer: Self-pay | Admitting: Internal Medicine

## 2020-08-10 ENCOUNTER — Encounter: Payer: 59 | Admitting: Internal Medicine

## 2020-08-15 ENCOUNTER — Encounter: Payer: 59 | Admitting: Internal Medicine

## 2021-02-08 ENCOUNTER — Encounter: Payer: Self-pay | Admitting: Internal Medicine

## 2021-02-27 ENCOUNTER — Other Ambulatory Visit: Payer: Self-pay

## 2021-02-27 ENCOUNTER — Ambulatory Visit (INDEPENDENT_AMBULATORY_CARE_PROVIDER_SITE_OTHER): Payer: 59 | Admitting: Internal Medicine

## 2021-02-27 ENCOUNTER — Encounter: Payer: Self-pay | Admitting: Internal Medicine

## 2021-02-27 VITALS — BP 120/62 | HR 62 | Temp 98.5°F | Resp 16 | Ht 76.0 in | Wt 293.5 lb

## 2021-02-27 DIAGNOSIS — Z23 Encounter for immunization: Secondary | ICD-10-CM

## 2021-02-27 DIAGNOSIS — Z0000 Encounter for general adult medical examination without abnormal findings: Secondary | ICD-10-CM

## 2021-02-27 DIAGNOSIS — Z1159 Encounter for screening for other viral diseases: Secondary | ICD-10-CM

## 2021-02-27 NOTE — Progress Notes (Signed)
   Subjective:    Patient ID: Wayne Hart, male    DOB: 10-18-1981, 40 y.o.   MRN: [id number]  DOS:  02/27/2021 Type of visit - description: CPX Since the last office visit in 2019 he is doing well. Has no major concerns at this point.    Review of Systems A 14 point review of systems is negative    Past Medical History:  Diagnosis Date  . Allergy    seasonal    Past Surgical History:  Procedure Laterality Date  . KNEE ARTHROSCOPY WITH EXCISION PLICA Right 08/06/2018   Procedure: KNEE ARTHROSCOPY WITH EXCISION PLICA;  Surgeon: Tarry Kos, MD;  Location: [facility];  Service: Orthopedics;  Laterality: Right;  . KNEE ARTHROSCOPY WITH MEDIAL MENISECTOMY Right 08/06/2018   Procedure: RIGHT KNEE ARTHROSCOPY WITH PARTIAL MEDIAL MENISCECTOMY;  Surgeon: Tarry Kos, MD;  Location: [facility];  Service: Orthopedics;  Laterality: Right;  injection of tennis elbow right 1cc marcaine .25 1cc depomedrol 40mg  and lidocaine 1 % 1cc  at 1230  . ORIF CLAVICLE FRACTURE Right 03/02/2009  . VASECTOMY  10/2015    Allergies as of 02/27/2021   No Known Allergies     Medication List       Accurate as of February 27, 2021 11:59 PM. If you have any questions, ask your nurse or doctor.        cetirizine 10 MG tablet Commonly known as: ZYRTEC Take 10 mg by mouth daily.   HYDROcodone-acetaminophen 5-325 MG tablet Commonly known as: Norco Take 1-2 tablets by mouth every 6 (six) hours as needed.   ibuprofen 200 MG tablet Commonly known as: ADVIL Take 200 mg by mouth every 6 (six) hours as needed.   ondansetron 4 MG tablet Commonly known as: ZOFRAN Take 1-2 tablets (4-8 mg total) by mouth every 8 (eight) hours as needed for nausea or vomiting.          Objective:   Physical Exam BP 120/62 (BP Location: Left Arm, Patient Position: Sitting, Cuff Size: Normal)   Pulse 62   Temp 98.5 F (36.9 C) (Oral)   Resp 16   Ht 6\' 4"  (1.93 m)   Wt 293 lb 8 oz (133.1  kg)   SpO2 98%   BMI 35.73 kg/m   General: Well developed, NAD, BMI noted Neck: No  thyromegaly  HEENT:  Normocephalic . Face symmetric, atraumatic Lungs:  CTA B Normal respiratory effort, no intercostal retractions, no accessory muscle use. Heart: RRR,  no murmur.  Abdomen:  Not distended, soft, non-tender. No rebound or rigidity.   Lower extremities: no pretibial edema bilaterally  Skin: Exposed areas without rash. Not pale. Not jaundice Neurologic:  alert & oriented X3.  Speech normal, gait appropriate for age and unassisted Strength symmetric and appropriate for age.  Psych: Cognition and judgment appear intact.  Cooperative with normal attention span and concentration.  Behavior appropriate. No anxious or depressed appearing.     Assessment    Assessment Overweight  PLAN  Here for CPX, doing well. RTC 1 year     This visit occurred during the SARS-CoV-2 public health emergency.  Safety protocols were in place, including screening questions prior to the visit, additional usage of staff PPE, and extensive cleaning of exam room while observing appropriate contact time as indicated for disinfecting solutions.

## 2021-02-27 NOTE — Patient Instructions (Signed)
   GO TO THE LAB : Get the blood work     GO TO THE FRONT DESK, PLEASE SCHEDULE YOUR APPOINTMENTS Come back for for a physical in 1 year

## 2021-02-28 ENCOUNTER — Encounter: Payer: Self-pay | Admitting: Internal Medicine

## 2021-02-28 LAB — COMPREHENSIVE METABOLIC PANEL
ALT: 21 U/L (ref 0–53)
AST: 23 U/L (ref 0–37)
Albumin: 4.3 g/dL (ref 3.5–5.2)
Alkaline Phosphatase: 72 U/L (ref 39–117)
BUN: 21 mg/dL (ref 6–23)
CO2: 29 mEq/L (ref 19–32)
Calcium: 9.9 mg/dL (ref 8.4–10.5)
Chloride: 102 mEq/L (ref 96–112)
Creatinine, Ser: 1.24 mg/dL (ref 0.40–1.50)
GFR: 73.65 mL/min (ref >60.00)
Glucose, Bld: 86 mg/dL (ref 70–99)
Potassium: 3.9 mEq/L (ref 3.5–5.1)
Sodium: 139 mEq/L (ref 135–145)
Total Bilirubin: 0.7 mg/dL (ref 0.2–1.2)
Total Protein: 7.1 g/dL (ref 6.0–8.3)

## 2021-02-28 LAB — CBC WITH DIFFERENTIAL/PLATELET
Basophils Absolute: 0.1 10*3/uL (ref 0.0–0.1)
Basophils Relative: 0.9 % (ref 0.0–3.0)
Eosinophils Absolute: 0.2 10*3/uL (ref 0.0–0.7)
Eosinophils Relative: 2.2 % (ref 0.0–5.0)
HCT: 42.5 % (ref 39.0–52.0)
Hemoglobin: 15 g/dL (ref 13.0–17.0)
Lymphocytes Relative: 31.1 % (ref 12.0–46.0)
Lymphs Abs: 2.4 10*3/uL (ref 0.7–4.0)
MCHC: 35.3 g/dL (ref 30.0–36.0)
MCV: 88.6 fl (ref 78.0–100.0)
Monocytes Absolute: 0.6 10*3/uL (ref 0.1–1.0)
Monocytes Relative: 8.2 % (ref 3.0–12.0)
Neutro Abs: 4.5 10*3/uL (ref 1.4–7.7)
Neutrophils Relative %: 57.6 % (ref 43.0–77.0)
Platelets: 211 10*3/uL (ref 150.0–400.0)
RBC: 4.8 Mil/uL (ref 4.22–5.81)
RDW: 13.2 % (ref 11.5–15.5)
WBC: 7.8 10*3/uL (ref 4.0–10.5)

## 2021-02-28 LAB — LIPID PANEL
Cholesterol: 182 mg/dL (ref 0–200)
HDL: 51.9 mg/dL (ref >39.00)
NonHDL: 129.98
Total CHOL/HDL Ratio: 4
Triglycerides: 201 mg/dL — ABNORMAL HIGH (ref 0.0–149.0)
VLDL: 40.2 mg/dL — ABNORMAL HIGH (ref 0.0–40.0)

## 2021-02-28 LAB — HEPATITIS C ANTIBODY
Hepatitis C Ab: NONREACTIVE
SIGNAL TO CUT-OFF: 0.01 (ref <1.00)

## 2021-02-28 LAB — TSH: TSH: 1.49 u[IU]/mL (ref 0.35–4.50)

## 2021-02-28 LAB — LDL CHOLESTEROL, DIRECT: Direct LDL: 109 mg/dL

## 2021-02-28 NOTE — Assessment & Plan Note (Signed)
Td 2012, booster today Covid VAX x2, counseled. No family history of prostate or colon cancer Labs: CMP, FLP, CBC, TSH, hep C Diet and exercise: Extensive discussion, he is very active, room for improvement of his diet.

## 2021-03-06 ENCOUNTER — Other Ambulatory Visit (HOSPITAL_COMMUNITY): Payer: Self-pay | Admitting: Family

## 2021-03-06 ENCOUNTER — Telehealth: Payer: 59 | Admitting: Family

## 2021-03-06 DIAGNOSIS — H109 Unspecified conjunctivitis: Secondary | ICD-10-CM

## 2021-03-06 MED ORDER — POLYMYXIN B-TRIMETHOPRIM 10000-0.1 UNIT/ML-% OP SOLN: 1.0000 [drp] | Freq: Four times a day (QID) | OPHTHALMIC | 0 refills | Status: DC

## 2021-03-06 NOTE — Progress Notes (Signed)
E-Visit for Pink Eye   We are sorry that you are not feeling well.  Here is how we plan to help!  Based on what you have shared with me it looks like you have conjunctivitis.  Conjunctivitis is a common inflammatory or infectious condition of the eye that is often referred to as "pink eye".  In most cases it is contagious (viral or bacterial). However, not all conjunctivitis requires antibiotics (ex. Allergic).  We have made appropriate suggestions for you based upon your presentation.  I have prescribed Polytrim Ophthalmic drops 1-2 drops 4 times a day times 5 days  Pink eye can be highly contagious.  It is typically spread through direct contact with secretions, or contaminated objects or surfaces that one may have touched.  Strict handwashing is suggested with soap and water is urged.  If not available, use alcohol based had sanitizer.  Avoid unnecessary touching of the eye.  If you wear contact lenses, you will need to refrain from wearing them until you see no white discharge from the eye for at least 24 hours after being on medication.  You should see symptom improvement in 1-2 days after starting the medication regimen.  Call us if symptoms are not improved in 1-2 days.  Home Care:  Wash your hands often!  Do not wear your contacts until you complete your treatment plan.  Avoid sharing towels, bed linen, personal items with a person who has pink eye.  See attention for anyone in your home with similar symptoms.  Get Help Right Away If:  Your symptoms do not improve.  You develop blurred or loss of vision.  Your symptoms worsen (increased discharge, pain or redness)  Your e-visit answers were reviewed by a board certified advanced clinical practitioner to complete your personal care plan.  Depending on the condition, your plan could have included both over the counter or prescription medications.  If there is a problem please reply  once you have received a response from your  provider.  Your safety is important to us.  If you have drug allergies check your prescription carefully.    You can use MyChart to ask questions about today's visit, request a non-urgent call back, or ask for a work or school excuse for 24 hours related to this e-Visit. If it has been greater than 24 hours you will need to follow up with your provider, or enter a new e-Visit to address those concerns.   You will get an e-mail in the next two days asking about your experience.  I hope that your e-visit has been valuable and will speed your recovery. Thank you for using e-visits.  Approximately 5 minutes was spent documenting and reviewing patient's chart.      

## 2021-07-11 DIAGNOSIS — H5213 Myopia, bilateral: Secondary | ICD-10-CM | POA: Diagnosis not present

## 2021-08-27 ENCOUNTER — Telehealth (INDEPENDENT_AMBULATORY_CARE_PROVIDER_SITE_OTHER): Payer: 59 | Admitting: Family Medicine

## 2021-08-27 ENCOUNTER — Other Ambulatory Visit: Payer: Self-pay

## 2021-08-27 ENCOUNTER — Encounter: Payer: Self-pay | Admitting: Family Medicine

## 2021-08-27 DIAGNOSIS — R42 Dizziness and giddiness: Secondary | ICD-10-CM

## 2021-08-27 NOTE — Patient Instructions (Signed)
How to Perform the Epley Maneuver The Epley maneuver is an exercise that relieves symptoms of vertigo. Vertigo is the feeling that you or your surroundings are moving when they are not. When you feel vertigo, you may feel like the room is spinning and may have trouble walking. The Epley maneuver is used for a type of vertigo caused by a calcium deposit in a part of the inner ear. The maneuver involves changing headpositions to help the deposit move out of the area. You can do this maneuver at home whenever you have symptoms of vertigo. You canrepeat it in 24 hours if your vertigo has not gone away. Even though the Epley maneuver may relieve your vertigo for a few weeks, it is possible that your symptoms will return. This maneuver relieves vertigo, but itdoes not relieve dizziness. What are the risks? If it is done correctly, the Epley maneuver is considered safe. Sometimes it can lead to dizziness or nausea that goes away after a short time. If you develop other symptoms--such as changes in vision, weakness, or numbness--stopdoing the maneuver and call your health care provider. Supplies needed: A bed or table. A pillow. How to do the Epley maneuver     Sit on the edge of a bed or table with your back straight and your legs extended or hanging over the edge of the bed or table. Turn your head halfway toward the affected ear or side as told by your health care provider. Lie backward quickly with your head turned until you are lying flat on your back. Your head should dangle (head-hanging position). You may want to position a pillow under your shoulders. Hold this position for at least 30 seconds. If you feel dizzy or have symptoms of vertigo, continue to hold the position until the symptoms stop. Turn your head to the opposite direction until your unaffected ear is facing down. Your head should continue to dangle. Hold this position for at least 30 seconds. If you feel dizzy or have symptoms of  vertigo, continue to hold the position until the symptoms stop. Turn your whole body to the same side as your head so that you are positioned on your side. Your head will now be nearly facedown and no longer needs to dangle. Hold for at least 30 seconds. If you feel dizzy or have symptoms of vertigo, continue to hold the position until the symptoms stop. Sit back up. You can repeat the maneuver in 24 hours if your vertigo does not go away. Follow these instructions at home: For 24 hours after doing the Epley maneuver: Keep your head in an upright position. When lying down to sleep or rest, keep your head raised (elevated) with two or more pillows. Avoid excessive neck movements. Activity Do not drive or use machinery if you feel dizzy. After doing the Epley maneuver, return to your normal activities as told by your health care provider. Ask your health care provider what activities are safe for you. General instructions Drink enough fluid to keep your urine pale yellow. Do not drink alcohol. Take over-the-counter and prescription medicines only as told by your health care provider. Keep all follow-up visits. This is important. Preventing vertigo symptoms Ask your health care provider if there is anything you should do at home to prevent vertigo. He or she may recommend that you: Keep your head elevated with two or more pillows while you sleep. Do not sleep on the side of your affected ear. Get up slowly from bed.   Avoid sudden movements during the day. Avoid extreme head positions or movement, such as looking up or bending over. Contact a health care provider if: Your vertigo gets worse. You have other symptoms, including: Nausea. Vomiting. Headache. Get help right away if you: Have vision changes. Have a headache or neck pain that is severe or getting worse. Cannot stop vomiting. Have new numbness or weakness in any part of your body. These symptoms may represent a serious problem  that is an emergency. Do not wait to see if the symptoms will go away. Get medical help right away. Call your local emergency services (911 in the [place] Do not drive yourself to the hospital. Summary Vertigo is the feeling that you or your surroundings are moving when they are not. The Epley maneuver is an exercise that relieves symptoms of vertigo. If the Epley maneuver is done correctly, it is considered safe. This information is not intended to replace advice given to you by your health care provider. Make sure you discuss any questions you have with your healthcare provider. Document Revised: 11/07/2020 Document Reviewed: 11/07/2020 Elsevier Patient Education  2022 Elsevier Inc.  

## 2021-08-27 NOTE — Progress Notes (Signed)
MyChart Video Visit    Virtual Visit via Video Note   This visit type was conducted due to national recommendations for restrictions regarding the COVID-19 Pandemic (e.g. social distancing) in an effort to limit this patient's exposure and mitigate transmission in our community. This patient is at least at moderate risk for complications without adequate follow up. This format is felt to be most appropriate for this patient at this time. Physical exam was limited by quality of the video and audio technology used for the visit. [name]  was able to get the patient set up on a video visit.  Patient location: Home Patient and provider in visit Provider location: Office  I discussed the limitations of evaluation and management by telemedicine and the availability of in person appointments. The patient expressed understanding and agreed to proceed.  Visit Date: 08/27/2021  Today's healthcare provider: Donato Schultz, DO     Subjective:    Patient ID: Wayne Hart, male    DOB: Jul 28, 1981, 40 y.o.   MRN: [id number]  Chief Complaint  Patient presents with   Chills    Pt COVID negative as of 09/04 and states having chills    HPI Patient is in today for a video visit.   He is complaining of dizziness, chills, sweats, slowed vision, light headache and body aches since late Friday night. He was driving back from performing a show and symptoms had an affect on his driving ability. Symptoms have been waxing and waning since. Patient denies runny nose, cough, and ear pain. Patient denies being dehydrated. Emesis occurred on Sunday and he drank electrolyte heavy drinks but has not experienced nausea since. He has a history of migraines. He says dizziness increases depending on speed of standing up or position his body is in.  He has been taking ibuprofen and dramamine to help with his symptoms.  He took a Covid-19 test on Sunday and results were negative. Plans to take another  test to confirm negative result.   Past Medical History:  Diagnosis Date   Allergy    seasonal    Past Surgical History:  Procedure Laterality Date   KNEE ARTHROSCOPY WITH EXCISION PLICA Right 08/06/2018   Procedure: KNEE ARTHROSCOPY WITH EXCISION PLICA;  Surgeon: Tarry Kos, MD;  Location: [facility];  Service: Orthopedics;  Laterality: Right;   KNEE ARTHROSCOPY WITH MEDIAL MENISECTOMY Right 08/06/2018   Procedure: RIGHT KNEE ARTHROSCOPY WITH PARTIAL MEDIAL MENISCECTOMY;  Surgeon: Tarry Kos, MD;  Location: [facility];  Service: Orthopedics;  Laterality: Right;  injection of tennis elbow right 1cc marcaine .25 1cc depomedrol 40mg  and lidocaine 1 % 1cc  at 1230   ORIF CLAVICLE FRACTURE Right 03/02/2009   VASECTOMY  10/2015    Family History  Problem Relation Age of Onset   Myelodysplastic syndrome Father 79   Diabetes Maternal Grandmother    Prostate cancer Neg Hx    Colon cancer Neg Hx    CAD Neg Hx     Social History   Socioeconomic History   Marital status: Married    Spouse name: Not on file   Number of children: 2   Years of education: Not on file   Highest education level: Not on file  Occupational History   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54  Tobacco Use   Smoking status: Some Days    Types: Cigars   Smokeless tobacco: Never   Tobacco comments:    occ cigar  Vaping Use   Vaping Use: Never used  Substance and Sexual Activity   Alcohol use: Yes    Comment: occasionally   Drug use: Never   Sexual activity: Not on file  Other Topics Concern   Not on file  Social History Narrative   Not on file   Social Determinants of Health   Financial Resource Strain: Not on file  Food Insecurity: Not on file  Transportation Needs: Not on file  Physical Activity: Not on file  Stress: Not on file  Social Connections: Not on file  Intimate Partner Violence: Not on file    Outpatient Medications Prior to Visit  Medication Sig  Dispense Refill   cetirizine (ZYRTEC) 10 MG tablet Take 10 mg by mouth daily.     trimethoprim-polymyxin b (POLYTRIM) ophthalmic solution Place 1 drop into the left eye every 6 (six) hours. 10 mL 0   trimethoprim-polymyxin b (POLYTRIM) ophthalmic solution PLACE 1 DROP INTO THE LEFT EYE EVERY 6 (SIX) HOURS. 10 mL 0   HYDROcodone-acetaminophen (NORCO) 5-325 MG tablet Take 1-2 tablets by mouth every 6 (six) hours as needed. (Patient not taking: No sig reported) 30 tablet 0   ibuprofen (ADVIL,MOTRIN) 200 MG tablet Take 200 mg by mouth every 6 (six) hours as needed. (Patient not taking: No sig reported)     ondansetron (ZOFRAN) 4 MG tablet Take 1-2 tablets (4-8 mg total) by mouth every 8 (eight) hours as needed for nausea or vomiting. (Patient not taking: No sig reported) 40 tablet 0   No facility-administered medications prior to visit.    No Known Allergies  Review of Systems  Constitutional:  Positive for chills and diaphoresis.  HENT:  Negative for congestion and ear pain.   Eyes:        (+) slowed vision  Respiratory:  Negative for cough.   Gastrointestinal:  Positive for nausea and vomiting.  Musculoskeletal:  Positive for joint pain and myalgias.  Neurological:  Positive for dizziness and headaches.      Objective:    Physical Exam Constitutional:      General: He is not in acute distress.    Appearance: Normal appearance. He is not ill-appearing.  HENT:     Head: Normocephalic and atraumatic.     Right Ear: External ear normal.     Left Ear: External ear normal.  Neurological:     Mental Status: He is alert.  Psychiatric:        Behavior: Behavior normal.        Judgment: Judgment normal.    There were no vitals taken for this visit. Wt Readings from Last 3 Encounters:  02/27/21 293 lb 8 oz (133.1 kg)  08/06/18 292 lb 12.8 oz (132.8 kg)  05/18/18 297 lb 2 oz (134.8 kg)    Diabetic Foot Exam - Simple   No data filed    Lab Results  Component Value Date   WBC  7.8 02/27/2021   HGB 15.0 02/27/2021   HCT 42.5 02/27/2021   PLT 211.0 02/27/2021   GLUCOSE 86 02/27/2021   CHOL 182 02/27/2021   TRIG 201.0 (H) 02/27/2021   HDL 51.90 02/27/2021   LDLDIRECT 109.0 02/27/2021   LDLCALC 82 02/21/2011   ALT 21 02/27/2021   AST 23 02/27/2021   NA 139 02/27/2021   K 3.9 02/27/2021   CL 102 02/27/2021   CREATININE 1.24 02/27/2021   BUN 21 02/27/2021   CO2 29 02/27/2021   TSH 1.49 02/27/2021   INR 1.0  03/01/2009    Lab Results  Component Value Date   TSH 1.49 02/27/2021   Lab Results  Component Value Date   WBC 7.8 02/27/2021   HGB 15.0 02/27/2021   HCT 42.5 02/27/2021   MCV 88.6 02/27/2021   PLT 211.0 02/27/2021   Lab Results  Component Value Date   NA 139 02/27/2021   K 3.9 02/27/2021   CO2 29 02/27/2021   GLUCOSE 86 02/27/2021   BUN 21 02/27/2021   CREATININE 1.24 02/27/2021   BILITOT 0.7 02/27/2021   ALKPHOS 72 02/27/2021   AST 23 02/27/2021   ALT 21 02/27/2021   PROT 7.1 02/27/2021   ALBUMIN 4.3 02/27/2021   CALCIUM 9.9 02/27/2021   GFR 73.65 02/27/2021   Lab Results  Component Value Date   CHOL 182 02/27/2021   Lab Results  Component Value Date   HDL 51.90 02/27/2021   Lab Results  Component Value Date   LDLCALC 82 02/21/2011   Lab Results  Component Value Date   TRIG 201.0 (H) 02/27/2021   Lab Results  Component Value Date   CHOLHDL 4 02/27/2021   No results found for: HGBA1C     Assessment & Plan:   Problem List Items Addressed This Visit       Unprioritized   Vertigo - Primary    Pt had a neg covid test  He will test again today Pt had dramamine  Will take antihistamine  epley manuver and advised him to make in person visit with pcp        No orders of the defined types were placed in this encounter.   I discussed the assessment and treatment plan with the patient. The patient was provided an opportunity to ask questions and all were answered. The patient agreed with the plan and  demonstrated an understanding of the instructions.   The patient was advised to call back or seek an in-person evaluation if the symptoms worsen or if the condition fails to improve as anticipated.   I,Shehryar Baig,acting as a Neurosurgeon for [employer], DO.,have documented all relevant documentation on the behalf of Donato Schultz, DO,as directed by  Donato Schultz, DO while in the presence of Donato Schultz, DO.   I provided 20 minutes of face-to-face time during this encounter.   Donato Schultz, DO [facility] [phone] (phone) [phone] (fax)  [facility]

## 2021-08-27 NOTE — Assessment & Plan Note (Signed)
Pt had a neg covid test  He will test again today Pt had dramamine  Will take antihistamine  epley manuver and advised him to make in person visit with pcp

## 2021-08-28 ENCOUNTER — Ambulatory Visit: Payer: 59 | Admitting: Internal Medicine

## 2021-08-30 ENCOUNTER — Ambulatory Visit: Payer: 59 | Admitting: Internal Medicine

## 2022-01-02 ENCOUNTER — Encounter: Payer: Self-pay | Admitting: Emergency Medicine

## 2022-01-02 ENCOUNTER — Ambulatory Visit (INDEPENDENT_AMBULATORY_CARE_PROVIDER_SITE_OTHER): Payer: 59

## 2022-01-02 ENCOUNTER — Ambulatory Visit: Admission: EM | Admit: 2022-01-02 | Discharge: 2022-01-02 | Discharge status: Against Medical Advice | Payer: 59

## 2022-01-02 ENCOUNTER — Ambulatory Visit
Admission: EM | Admit: 2022-01-02 | Discharge: 2022-01-02 | Disposition: A | Discharge status: Home / Self Care | Payer: 59 | Attending: Physician Assistant | Admitting: Physician Assistant

## 2022-01-02 ENCOUNTER — Other Ambulatory Visit: Payer: Self-pay

## 2022-01-02 DIAGNOSIS — R0602 Shortness of breath: Secondary | ICD-10-CM | POA: Diagnosis not present

## 2022-01-02 MED ORDER — ALBUTEROL SULFATE HFA 108 (90 BASE) MCG/ACT IN AERS: 1.0000 | INHALATION_SPRAY | Freq: Four times a day (QID) | RESPIRATORY_TRACT | 0 refills | Status: DC | PRN

## 2022-01-02 NOTE — ED Triage Notes (Signed)
Patient c/o SOB x 1 week, no cough, no congestion, worse with lying down.  Denies any OTC meds

## 2022-01-02 NOTE — ED Provider Notes (Signed)
[facility]    CSN: [id number] Arrival date & time: 01/02/22  1138      History   Chief Complaint Chief Complaint  Patient presents with   Shortness of Breath    HPI Wayne Hart is a 41 y.o. male.   Patient here today for evaluation of shortness of breath that has been ongoing the last week. He reports symptoms seem to be worse when he lies down to sleep at night. He has not had any cough. He denies any fever. He has not had any other symptoms.   The history is provided by the patient.  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   S/P right knee arthroscopy 08/19/2018   Acute medial meniscus tear of right knee    Plica of knee, right    Annual physical exam 05/18/2018   BACK PAIN 02/21/2011    Past Surgical History:  Procedure Laterality Date   KNEE ARTHROSCOPY WITH EXCISION PLICA Right [id number]   Procedure: KNEE ARTHROSCOPY WITH EXCISION PLICA;  Surgeon: Leandrew Koyanagi, MD;  Location: [place];  Service: Orthopedics;  Laterality: Right;   KNEE ARTHROSCOPY WITH MEDIAL MENISECTOMY Right 08/06/2018   Procedure: RIGHT KNEE ARTHROSCOPY WITH PARTIAL MEDIAL MENISCECTOMY;  Surgeon: Leandrew Koyanagi, MD;  Location: [place];  Service: Orthopedics;  Laterality: Right;  injection of tennis elbow right 1cc marcaine .25 1cc depomedrol 40mg  and lidocaine 1 % 1cc  at 1230   ORIF CLAVICLE FRACTURE Right 03/02/2009   VASECTOMY  10/2015       Home Medications    Prior to Admission medications   Medication Sig Start Date End Date Taking? Authorizing Provider  albuterol (VENTOLIN HFA) 108 (90 Base) MCG/ACT inhaler Inhale 1-2 puffs into the lungs every 6 (six) hours as needed for wheezing or shortness  of breath. 01/02/22  Yes Francene Finders, PA-C  cetirizine (ZYRTEC) 10 MG tablet Take 10 mg by mouth daily.    [provider]  trimethoprim-polymyxin b (POLYTRIM) ophthalmic solution Place 1 drop into the left eye every 6 (six) hours. 03/06/21   Sharion Balloon, FNP  trimethoprim-polymyxin b (POLYTRIM) ophthalmic solution PLACE 1 DROP INTO THE LEFT EYE EVERY 6 (SIX) HOURS. 03/06/21 03/06/22  Sharion Balloon, FNP    Family History Family History  Problem Relation Age of Onset   Myelodysplastic syndrome Father 7   Diabetes Maternal Grandmother    Prostate cancer Neg Hx    Colon cancer Neg Hx    CAD Neg Hx     Social History Social History   Tobacco Use   Smoking status: Some Days    Types: Cigars   Smokeless tobacco: Never   Tobacco comments:    occ cigar  Vaping Use   Vaping Use: Never used  Substance Use Topics   Alcohol use: Yes    Comment: occasionally   Drug use: Never     Allergies   Patient has no known allergies.   Review of Systems Review of Systems  Constitutional:  Negative for chills and fever.  HENT:  Negative for congestion and sore throat.   Eyes:  Negative for discharge and redness.  Respiratory:  Positive for shortness  of breath. Negative for cough and wheezing.   Cardiovascular:  Negative for chest pain.    Physical Exam Triage Vital Signs ED Triage Vitals  Enc Vitals Group     BP 01/02/22 1224 (!) 143/95     Pulse Rate 01/02/22 1224 61     Resp 01/02/22 1224 20     Temp 01/02/22 1224 97.6 F (36.4 C)     Temp Source 01/02/22 1224 Oral     SpO2 01/02/22 1224 98 %     Weight 01/02/22 1225 293 lb 6.9 oz (133.1 kg)     Height 01/02/22 1225 6\' 4"  (1.93 m)     Head Circumference --      Peak Flow --      Pain Score 01/02/22 1224 0     Pain Loc --      Pain Edu? --      Excl. in [place]? --    No data found.  Updated Vital Signs BP (!) 143/95 (BP Location: Left Arm)    Pulse 61    Temp 97.6 F (36.4 C) (Oral)    Resp 20    Ht 6\' 4"   (1.93 m)    Wt 293 lb 6.9 oz (133.1 kg)    SpO2 98%    BMI 35.72 kg/m      Physical Exam Vitals and nursing note reviewed.  Constitutional:      General: He is not in acute distress.    Appearance: He is well-developed. He is not ill-appearing.  HENT:     Head: Normocephalic and atraumatic.     Nose: Nose normal.  Eyes:     Conjunctiva/sclera: Conjunctivae normal.  Cardiovascular:     Rate and Rhythm: Normal rate and regular rhythm.     Heart sounds: Normal heart sounds. No murmur heard. Pulmonary:     Effort: Pulmonary effort is normal. No respiratory distress.     Breath sounds: Normal breath sounds. No wheezing, rhonchi or rales.  Neurological:     Mental Status: He is alert.  Psychiatric:        Mood and Affect: Mood normal.        Behavior: Behavior normal.     UC Treatments / Results  Labs (all labs ordered are listed, but only abnormal results are displayed) Labs Reviewed - No data to display  EKG   Radiology DG Chest 2 View  Result Date: 01/02/2022 CLINICAL DATA:  Shortness of breath for a week EXAM: CHEST - 2 VIEW COMPARISON:  Chest radiograph 11/21/2015 FINDINGS: The cardiomediastinal silhouette is normal. There is no focal consolidation or pulmonary edema. There is no pleural effusion or pneumothorax. Plate fixation of the right clavicle is again noted. There is no acute osseous abnormality. IMPRESSION: No radiographic evidence of acute cardiopulmonary process. Electronically Signed   By: Valetta Mole M.D.   On: 01/02/2022 12:56    Procedures Procedures (including critical care time)  Medications Ordered in UC Medications - No data to display  Initial Impression / Assessment and Plan / UC Course  I have reviewed the triage vital signs and the nursing notes.  Pertinent labs & imaging results that were available during my care of the patient were reviewed by me and considered in my medical decision making (see chart for details).    Unclear etiology of  patient's symptoms. CXR was normal. Discussed options of albuterol inhaler vs oral steroids to cover possible bronchitis, reactive airway vs watchful waiting. Discussed possibility of symptoms being triggered  by anxiety as well given lack of any other symptoms. Recommended follow up with PCP for further evaluation. Patient would like to trial albuterol inhaler for treatment.   Final Clinical Impressions(s) / UC Diagnoses   Final diagnoses:  Shortness of breath   Discharge Instructions   None    ED Prescriptions     Medication Sig Dispense Auth. Provider   albuterol (VENTOLIN HFA) 108 (90 Base) MCG/ACT inhaler Inhale 1-2 puffs into the lungs every 6 (six) hours as needed for wheezing or shortness of breath. 8 g Francene Finders, PA-C      PDMP not reviewed this encounter.   Francene Finders, PA-C 01/02/22 1325

## 2022-01-19 IMAGING — DX DG CHEST 2V
3 series · 3 of 3 positions shown · non-contrast
Comparison: Chest radiograph 11/21/2015

CLINICAL DATA: Shortness of breath for a week

EXAM:
CHEST - 2 VIEW

[chest pa (1 of 2)]
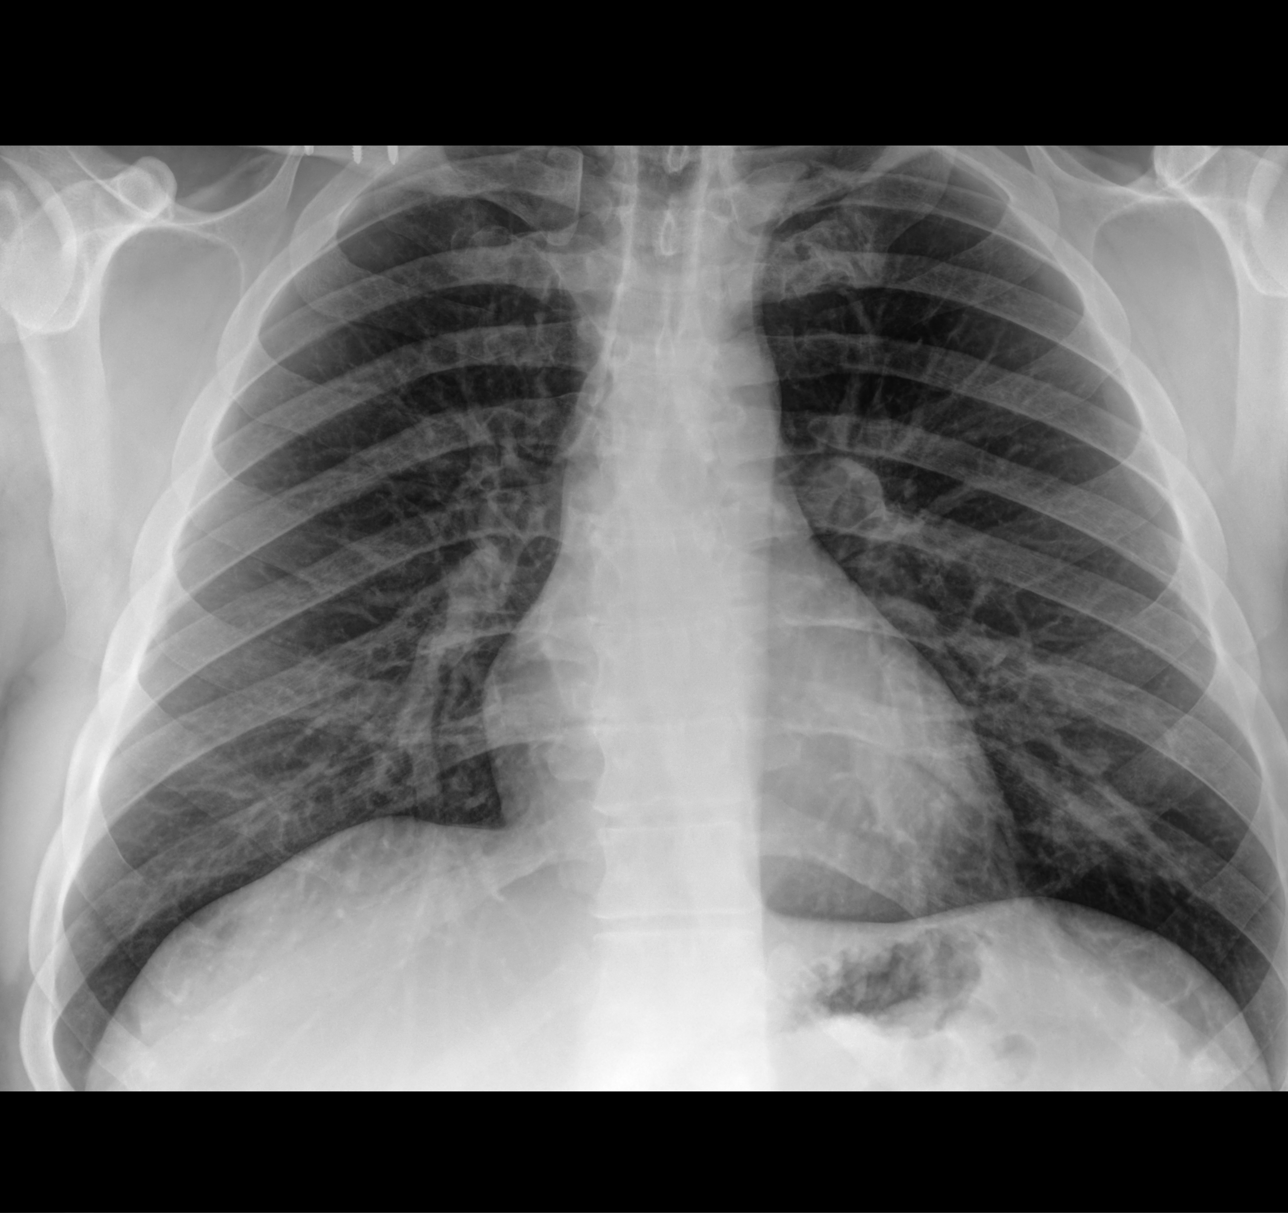

[chest pa (2 of 2)]
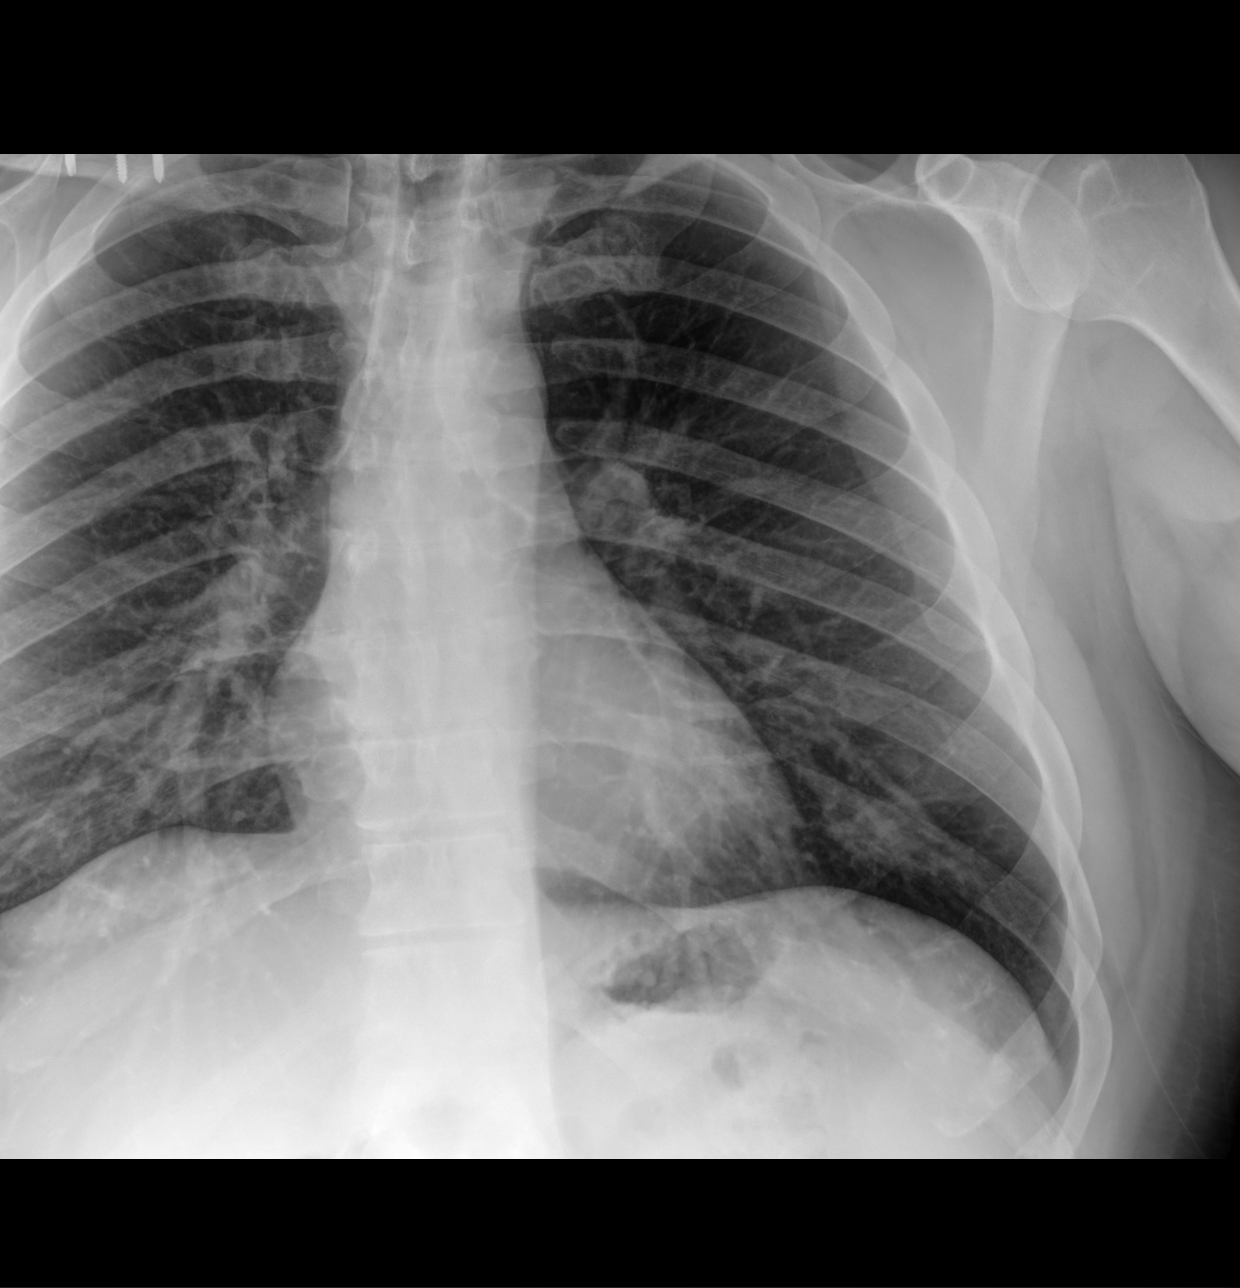

[chest lat]
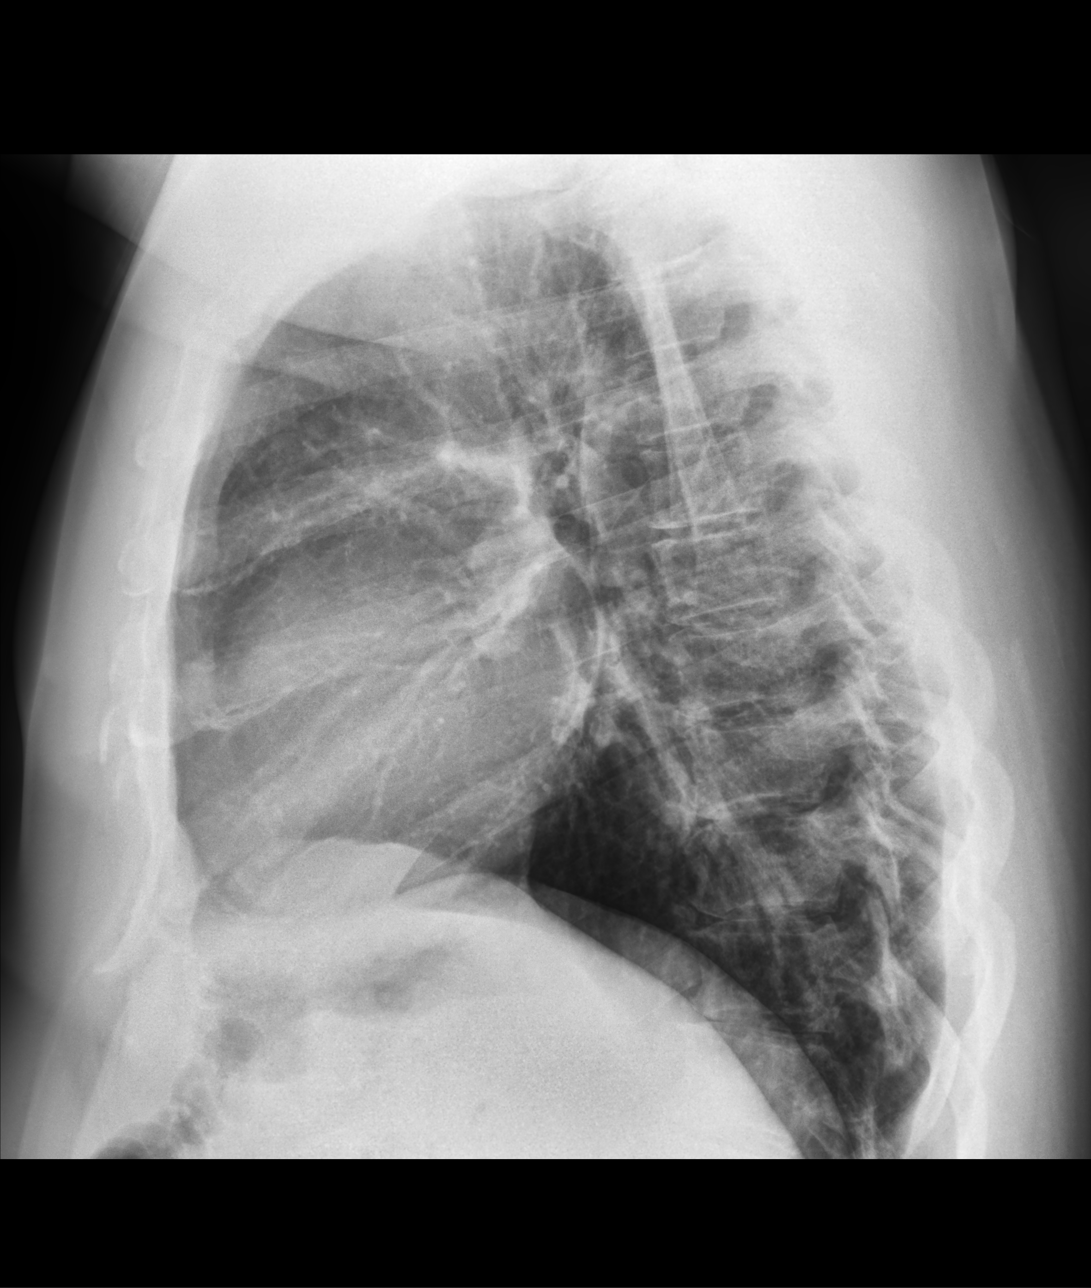

[3 of 3 positions shown; findings below may reference images not displayed]

FINDINGS: The cardiomediastinal silhouette is normal.

There is no focal consolidation or pulmonary edema. There is no
pleural effusion or pneumothorax.

Plate fixation of the right clavicle is again noted. There is no
acute osseous abnormality.
IMPRESSION: No radiographic evidence of acute cardiopulmonary process.

## 2022-02-27 ENCOUNTER — Encounter: Payer: Self-pay | Admitting: Internal Medicine

## 2022-02-28 ENCOUNTER — Encounter: Payer: 59 | Admitting: Internal Medicine

## 2022-03-04 ENCOUNTER — Encounter: Payer: 59 | Admitting: Internal Medicine

## 2022-03-20 ENCOUNTER — Ambulatory Visit (INDEPENDENT_AMBULATORY_CARE_PROVIDER_SITE_OTHER): Payer: 59 | Admitting: Internal Medicine

## 2022-03-20 ENCOUNTER — Encounter: Payer: Self-pay | Admitting: Internal Medicine

## 2022-03-20 VITALS — BP 128/72 | HR 69 | Temp 98.1°F | Resp 16 | Ht 76.0 in | Wt 312.0 lb

## 2022-03-20 DIAGNOSIS — Z0000 Encounter for general adult medical examination without abnormal findings: Secondary | ICD-10-CM

## 2022-03-20 LAB — CBC WITH DIFFERENTIAL/PLATELET
Basophils Absolute: 0 10*3/uL (ref 0.0–0.1)
Basophils Relative: 0.6 % (ref 0.0–3.0)
Eosinophils Absolute: 0.2 10*3/uL (ref 0.0–0.7)
Eosinophils Relative: 3.2 % (ref 0.0–5.0)
HCT: 42.4 % (ref 39.0–52.0)
Hemoglobin: 14.9 g/dL (ref 13.0–17.0)
Lymphocytes Relative: 30.9 % (ref 12.0–46.0)
Lymphs Abs: 2.2 10*3/uL (ref 0.7–4.0)
MCHC: 35.3 g/dL (ref 30.0–36.0)
MCV: 89.6 fl (ref 78.0–100.0)
Monocytes Absolute: 0.5 10*3/uL (ref 0.1–1.0)
Monocytes Relative: 6.9 % (ref 3.0–12.0)
Neutro Abs: 4.2 10*3/uL (ref 1.4–7.7)
Neutrophils Relative %: 58.4 % (ref 43.0–77.0)
Platelets: 233 10*3/uL (ref 150.0–400.0)
RBC: 4.73 Mil/uL (ref 4.22–5.81)
RDW: 13.5 % (ref 11.5–15.5)
WBC: 7.2 10*3/uL (ref 4.0–10.5)

## 2022-03-20 LAB — COMPREHENSIVE METABOLIC PANEL
ALT: 24 U/L (ref 0–53)
AST: 23 U/L (ref 0–37)
Albumin: 4.5 g/dL (ref 3.5–5.2)
Alkaline Phosphatase: 74 U/L (ref 39–117)
BUN: 15 mg/dL (ref 6–23)
CO2: 27 mEq/L (ref 19–32)
Calcium: 9.7 mg/dL (ref 8.4–10.5)
Chloride: 103 mEq/L (ref 96–112)
Creatinine, Ser: 1.07 mg/dL (ref 0.40–1.50)
GFR: 87.25 mL/min (ref >60.00)
Glucose, Bld: 92 mg/dL (ref 70–99)
Potassium: 4.1 mEq/L (ref 3.5–5.1)
Sodium: 138 mEq/L (ref 135–145)
Total Bilirubin: 0.7 mg/dL (ref 0.2–1.2)
Total Protein: 7.2 g/dL (ref 6.0–8.3)

## 2022-03-20 LAB — LIPID PANEL
Cholesterol: 196 mg/dL (ref 0–200)
HDL: 50.2 mg/dL (ref >39.00)
LDL Cholesterol: 110 mg/dL — ABNORMAL HIGH (ref 0–99)
NonHDL: 145.46
Total CHOL/HDL Ratio: 4
Triglycerides: 178 mg/dL — ABNORMAL HIGH (ref 0.0–149.0)
VLDL: 35.6 mg/dL (ref 0.0–40.0)

## 2022-03-20 LAB — TSH: TSH: 1.29 u[IU]/mL (ref 0.35–5.50)

## 2022-03-20 NOTE — Progress Notes (Signed)
? ?Subjective:  ? ? Patient ID: Wayne Hart, male    DOB: 1981-08-21, 41 y.o.   MRN: [id number] ? ?DOS:  03/20/2022 ?Type of visit - description: cpx ? ?Since the last office visit is doing well. ?Went to the ER few weeks ago, was somewhat short of breath, thinks he had a virus, was prescribed albuterol, use it a couple of times, he is completely back to normal. ? ?Review of Systems ? ?Other than above, a 14 point review of systems is negative  ? ? ?Past Medical History:  ?Diagnosis Date  ? Allergy   ? seasonal  ? ? ?Past Surgical History:  ?Procedure Laterality Date  ? KNEE ARTHROSCOPY WITH EXCISION PLICA Right [id number]  ? Procedure: KNEE ARTHROSCOPY WITH EXCISION PLICA;  Surgeon: Leandrew Koyanagi, MD;  Location: [facility];  Service: Orthopedics;  Laterality: Right;  ? KNEE ARTHROSCOPY WITH MEDIAL MENISECTOMY Right 08/06/2018  ? Procedure: RIGHT KNEE ARTHROSCOPY WITH PARTIAL MEDIAL MENISCECTOMY;  Surgeon: Leandrew Koyanagi, MD;  Location: [facility];  Service: Orthopedics;  Laterality: Right;  injection of tennis elbow right 1cc marcaine .25 1cc depomedrol 40mg  and lidocaine 1 % 1cc  at 1230  ? ORIF CLAVICLE FRACTURE Right 03/02/2009  ? VASECTOMY  10/2015  ? ?Social History  ? ?Socioeconomic History  ? Marital status: Married  ?  Spouse name: Not on file  ? Number of children: 2  ? Years of education: Not on file  ? Highest education level: Not on file  ?Occupational History  ? 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Human resources officer  ?Tobacco Use  ? Smoking status: Some Days  ?  Types: Cigars  ? Smokeless tobacco: Never  ? Tobacco comments:  ?  occ cigar  ?Vaping Use  ? Vaping Use: Never used  ?Substance and Sexual Activity  ? Alcohol use: Yes  ?  Comment: occasionally  ? Drug use: Never  ? Sexual activity: Not on file  ?Other Topics Concern  ? Not on file  ?Social History Narrative  ? Not on file  ? ?Social Determinants of Health  ? ?Financial Resource Strain: Not on file  ?Food Insecurity: Not on file   ?Transportation Needs: Not on file  ?Physical Activity: Not on file  ?Stress: Not on file  ?Social Connections: Not on file  ?Intimate Partner Violence: Not on file  ? ? ?Current Outpatient Medications  ?Medication Instructions  ? albuterol (VENTOLIN HFA) 108 (90 Base) MCG/ACT inhaler 1-2 puffs, Inhalation, Every 6 hours PRN  ? cetirizine (ZYRTEC) 10 mg, Oral, Daily  ? ? ?   ?Objective:  ? Physical Exam ?BP 128/72 (BP Location: Left Arm, Patient Position: Sitting, Cuff Size: Normal)   Pulse 69   Temp 98.1 ?F (36.7 ?C) (Oral)   Resp 16   Ht 6\' 4"  (1.93 m)   Wt (!) 312 lb (141.5 kg)   SpO2 97%   BMI 37.98 kg/m?  ?General: ?Well developed, NAD, BMI noted ?Neck: No  thyromegaly  ?HEENT:  ?Normocephalic . Face symmetric, atraumatic ?Lungs:  ?CTA B ?Normal respiratory effort, no intercostal retractions, no accessory muscle use. ?Heart: RRR,  no murmur.  ?Abdomen:  ?Not distended, soft, non-tender. No rebound or rigidity.   ?Lower extremities: no pretibial edema bilaterally  ?Skin: Exposed areas without rash. Not pale. Not jaundice ?Neurologic:  ?alert & oriented X3.  ?Speech normal, gait appropriate for age and unassisted ?Strength symmetric and appropriate for age.  ?Psych: ?Cognition and judgment appear intact.  ?Cooperative with  normal attention span and concentration.  ?Behavior appropriate. ?No anxious or depressed appearing. ? ?   ?Assessment   ? ?ASSESSMENT ?Overweight ? ?PLAN  ?Here for CPX, doing well. ?Obesity: Diet and exercise discussed with patient. ?RTC 1 year ? ? ?This visit occurred during the SARS-CoV-2 public health emergency.  Safety protocols were in place, including screening questions prior to the visit, additional usage of staff PPE, and extensive cleaning of exam room while observing appropriate contact time as indicated for disinfecting solutions.  ? ?

## 2022-03-20 NOTE — Patient Instructions (Signed)
Think about the diet that fits  your lifestyle. ?Your goal of 275 pounds is achievable. ? ?GO TO THE LAB : Get the blood work   ? ? ?GO TO THE FRONT DESK, PLEASE SCHEDULE YOUR APPOINTMENTS ?Come back for a physical exam in 1 year ?

## 2022-03-21 ENCOUNTER — Encounter: Payer: Self-pay | Admitting: Internal Medicine

## 2022-03-21 NOTE — Assessment & Plan Note (Signed)
Td 2022 ?Covid VAX: booster d/w pt ?Rec flu shots seasonally  ?No family history of prostate or colon cancer ?Labs: CMP, FLP, CBC TSH ?Lifestyle: He is very active, diet is an issue, we had a conversation about options.  His goal is 275 pounds which I think is achievable ?  ?

## 2022-07-31 ENCOUNTER — Other Ambulatory Visit (HOSPITAL_COMMUNITY): Payer: Self-pay

## 2022-09-29 ENCOUNTER — Telehealth: Payer: 59 | Admitting: Emergency Medicine

## 2022-09-29 DIAGNOSIS — H9203 Otalgia, bilateral: Secondary | ICD-10-CM

## 2022-09-29 MED ORDER — AMOXICILLIN-POT CLAVULANATE 875-125 MG PO TABS
1.0000 | ORAL_TABLET | Freq: Two times a day (BID) | ORAL | 0 refills | Status: DC
Start: 2022-09-29 — End: 2023-05-11

## 2022-09-29 NOTE — Progress Notes (Signed)
Virtual Visit Consent   Wayne Hart, you are scheduled for a virtual visit with a [facility] provider today. Just as with appointments in the office, your consent must be obtained to participate. Your consent will be active for this visit and any virtual visit you may have with one of our providers in the next 365 days. If you have a MyChart account, a copy of this consent can be sent to you electronically.  As this is a virtual visit, video technology does not allow for your provider to perform a traditional examination. This may limit your provider's ability to fully assess your condition. If your provider identifies any concerns that need to be evaluated in person or the need to arrange testing (such as labs, EKG, etc.), we will make arrangements to do so. Although advances in technology are sophisticated, we cannot ensure that it will always work on either your end or our end. If the connection with a video visit is poor, the visit may have to be switched to a telephone visit. With either a video or telephone visit, we are not always able to ensure that we have a secure connection.  By engaging in this virtual visit, you consent to the provision of healthcare and authorize for your insurance to be billed (if applicable) for the services provided during this visit. Depending on your insurance coverage, you may receive a charge related to this service.  I need to obtain your verbal consent now. Are you willing to proceed with your visit today? Wayne Hart has provided verbal consent on 09/29/2022 for a virtual visit (video or telephone). Montine Circle, PA-C  Date: 09/29/2022 3:09 PM  Virtual Visit via Video Note   I, Montine Circle, connected with  Wayne Hart  ([id number], Apr 28, 1981) on 09/29/22 at  3:15 PM EDT by a video-enabled telemedicine application and verified that I am speaking with the correct person using two identifiers.  Location: Patient: Virtual Visit Location Patient:  Home Provider: Virtual Visit Location Provider: Mobile   I discussed the limitations of evaluation and management by telemedicine and the availability of in person appointments. The patient expressed understanding and agreed to proceed.    History of Present Illness: Wayne Hart is a 41 y.o. who identifies as a male who was assigned male at birth, and is being seen today for ear fullness and pain with tinnitus for the past 4 days.  States that symptoms have been worsening.  Denies fever, mild nasal congestion.  Has tried taking Zyrtec and flonase without much relief.  Denies any other symptoms. 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 (Patient not taking: Reported on 03/20/2022), Disp: 8 g, Rfl: 0   cetirizine (ZYRTEC) 10 MG tablet, Take 10 mg by mouth daily., Disp: , Rfl:   Observations/Objective: Patient is well-developed, well-nourished in no acute distress.  Resting comfortably  at home.  Head is normocephalic, atraumatic.  No labored breathing.  Speech  is clear and coherent with logical content.  Patient is alert and oriented at baseline.    Assessment and Plan: 1. Otalgia of both ears  Discussed that this might be otitis or sinusitis, but could be viral as well.  Without an otoscopic exam it is hard to know.  Will trial Augmentin.  Continue Zyrtec and Flonase.  Follow Up Instructions: I discussed the assessment and treatment plan with the patient. The patient was provided an opportunity  to ask questions and all were answered. The patient agreed with the plan and demonstrated an understanding of the instructions.  A copy of instructions were sent to the patient via MyChart unless otherwise noted below.     The patient was advised to call back or seek an in-person evaluation if the symptoms worsen or if the condition fails to improve as anticipated.  Time:  I spent 11 minutes with the patient via telehealth technology discussing the above problems/concerns.    Roxy Horseman, PA-C

## 2022-10-07 ENCOUNTER — Encounter: Payer: Self-pay | Admitting: Orthopaedic Surgery

## 2022-10-07 ENCOUNTER — Ambulatory Visit (INDEPENDENT_AMBULATORY_CARE_PROVIDER_SITE_OTHER): Payer: 59 | Admitting: Orthopaedic Surgery

## 2022-10-07 DIAGNOSIS — M7712 Lateral epicondylitis, left elbow: Secondary | ICD-10-CM

## 2022-10-07 NOTE — Progress Notes (Signed)
Office Visit Note   Patient: Wayne Hart           Date of Birth: April 04, 1981           MRN: [id number] Visit Date: 10/07/2022              Requested by: Wanda Plump, MD [address] [place],  [place] [address] PCP: Wanda Plump, MD   Assessment & Plan: Visit Diagnoses:  1. Lateral epicondylitis, left elbow     Plan: Impression is left elbow lateral epicondylitis.  Disease process and treatment options were discussed and we agreed to start with dry needling therefore placed a referral for PT at [facility].  He also understands the importance of rest and this affects healing.  Questions encouraged and answered.  Follow-up as needed.  Follow-Up Instructions: No follow-ups on file.   Orders:  Orders Placed This Encounter  Procedures   Ambulatory referral to Physical Therapy   No orders of the defined types were placed in this encounter.     Procedures: No procedures performed   Clinical Data: No additional findings.   Subjective: Chief Complaint  Patient presents with   Left Elbow - Pain    HPI Wayne Hart is a 41 year old gentleman for evaluation of 6 weeks lateral left elbow pain worse with picking up things and grasping.  He is right-hand dominant.  He had a case of right tennis elbow a few years back that resolved after period of rest and cortisone injection.  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  He feels like the symptoms started after he was picking up 80 pound tables.  He owns a [employer].  Denies any numbness and tingling.  The pain has kept him up a couple of nights but for the most part he is able to sleep.  Counterforce brace does not help.  Review of Systems  Constitutional: Negative.   All other systems reviewed and are negative.    Objective: Vital Signs: There were no vitals taken for this visit.  Physical Exam Vitals and nursing note reviewed.  Constitutional:      Appearance: He is well-developed.  HENT:     Head: Normocephalic  and atraumatic.  Eyes:     Pupils: Pupils are equal, round, and reactive to light.  Pulmonary:     Effort: Pulmonary effort is normal.  Abdominal:     Palpations: Abdomen is soft.  Musculoskeletal:        General: Normal range of motion.     Cervical back: Neck supple.  Skin:    General: Skin is warm.  Neurological:     Mental Status: He is alert and oriented to person, place, and time.  Psychiatric:        Behavior: Behavior normal.        Thought Content: Thought content normal.        Judgment: Judgment normal.    Ortho Exam Examination of the left elbow shows full range of motion as well as pronation supination.  He has tenderness to the lateral epicondyle and the common extensor tendon.  He has pain with resisted wrist and long finger extension.  Radial tunnel is nontender.   Specialty Comments:  No specialty comments available.  Imaging: No results found.   PMFS History: Patient Active Problem List   Diagnosis Date Noted   Lateral epicondylitis, left elbow 10/07/2022   Vertigo 08/27/2021   Epistaxis, recurrent 06/27/2019   S/P right knee arthroscopy  08/19/2018   Acute medial meniscus tear of right knee    Plica of knee, right    Annual physical exam 05/18/2018   BACK PAIN 02/21/2011   Past Medical History:  Diagnosis Date   Allergy    seasonal    Family History  Problem Relation Age of Onset   Myelodysplastic syndrome Father 57   Diabetes Maternal Grandmother    Prostate cancer Neg Hx    Colon cancer Neg Hx    CAD Neg Hx     Past Surgical History:  Procedure Laterality Date   KNEE ARTHROSCOPY WITH EXCISION PLICA Right 08/08/2992   Procedure: KNEE ARTHROSCOPY WITH EXCISION PLICA;  Surgeon: Leandrew Koyanagi, MD;  Location: [facility];  Service: Orthopedics;  Laterality: Right;   KNEE ARTHROSCOPY WITH MEDIAL MENISECTOMY Right 08/06/2018   Procedure: RIGHT KNEE ARTHROSCOPY WITH PARTIAL MEDIAL MENISCECTOMY;  Surgeon: Leandrew Koyanagi, MD;  Location:  [facility];  Service: Orthopedics;  Laterality: Right;  injection of tennis elbow right 1cc marcaine .25 1cc depomedrol 40mg  and lidocaine 1 % 1cc  at 1230   ORIF CLAVICLE FRACTURE Right 03/02/2009   VASECTOMY  10/2015   Social History   Occupational History   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Human resources officer  Tobacco Use   Smoking status: Some Days    Types: Cigars   Smokeless tobacco: Never   Tobacco comments:    occ cigar  Vaping Use   Vaping Use: Never used  Substance and Sexual Activity   Alcohol use: Yes    Comment: occasionally   Drug use: Never   Sexual activity: Not on file

## 2022-10-08 ENCOUNTER — Other Ambulatory Visit: Payer: Self-pay

## 2022-10-08 ENCOUNTER — Ambulatory Visit: Payer: 59 | Attending: Orthopaedic Surgery | Admitting: Physical Therapy

## 2022-10-08 ENCOUNTER — Encounter: Payer: Self-pay | Admitting: Physical Therapy

## 2022-10-08 DIAGNOSIS — M25522 Pain in left elbow: Secondary | ICD-10-CM | POA: Diagnosis not present

## 2022-10-08 DIAGNOSIS — M7712 Lateral epicondylitis, left elbow: Secondary | ICD-10-CM | POA: Diagnosis not present

## 2022-10-08 DIAGNOSIS — R252 Cramp and spasm: Secondary | ICD-10-CM | POA: Insufficient documentation

## 2022-10-08 NOTE — Therapy (Signed)
OUTPATIENT PHYSICAL THERAPY UPPER EXTREMITY EVALUATION   Patient Name: KAIPO ARDIS MRN: [id number] DOB:[birth year], 41 y.o., male Today's Date: 10/08/2022   PT End of Session - 10/08/22 0840     Visit Number 1    Date for PT Re-Evaluation 12/03/22    Authorization Type UMR    PT Start Time [phone]    PT Stop Time 0930    PT Time Calculation (min) 47 min    Activity Tolerance Patient tolerated treatment well    Behavior During Therapy [facility] for tasks assessed/performed             Past Medical History:  Diagnosis Date   Allergy    seasonal   Past Surgical History:  Procedure Laterality Date   KNEE ARTHROSCOPY WITH EXCISION PLICA Right 08/06/2018   Procedure: KNEE ARTHROSCOPY WITH EXCISION PLICA;  Surgeon: Tarry Kos, MD;  Location: [facility];  Service: Orthopedics;  Laterality: Right;   KNEE ARTHROSCOPY WITH MEDIAL MENISECTOMY Right 08/06/2018   Procedure: RIGHT KNEE ARTHROSCOPY WITH PARTIAL MEDIAL MENISCECTOMY;  Surgeon: Tarry Kos, MD;  Location: [facility];  Service: Orthopedics;  Laterality: Right;  injection of tennis elbow right 1cc marcaine .25 1cc depomedrol 40mg  and lidocaine 1 % 1cc  at 1230   ORIF CLAVICLE FRACTURE Right 03/02/2009   VASECTOMY  10/2015   Patient Active Problem List   Diagnosis Date Noted   Lateral epicondylitis, left elbow 10/07/2022   Vertigo 08/27/2021   Epistaxis, recurrent 06/27/2019   S/P right knee arthroscopy 08/19/2018   Acute medial meniscus tear of right knee    Plica of knee, right    Annual physical exam 05/18/2018   BACK PAIN 02/21/2011    PCP: 04/23/2011, MD  REFERRING PROVIDER: Wanda Plump, MD   REFERRING DIAG: [phone] (ICD-10-CM) - Lateral epicondylitis, left elbow   THERAPY DIAG:  Pain in left elbow  Cramp and spasm  Rationale for Evaluation and Treatment Rehabilitation  ONSET DATE: 1.5 mos for Lt elbow (has had Rt elbow pain before)  SUBJECTIVE:                                                                                                                                                                                       SUBJECTIVE STATEMENT: Lt elbow pain x 1.5 mos.  Has has a history of episodic Rt elbow pain with cortisone treatment but MD wants PT to try DN this time.  Pt is Rt hand dominant.  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  Pt also plays racquetball but that is all right handed.  Pt has been wearing an elbow sleeve as needed at work.  He has tried using velcro brace and doesn't help. Elbow hurts with reaching for light objects and has even kept him up at night a week ago.  Pain intensity fluctuates.  PERTINENT HISTORY: Allergies Rt knee surgery 5 years ago for meniscus tear  PAIN:  PAIN:  Are you having pain? Yes NPRS scale: 3-9/10 Pain location: Lt elbow Pain orientation: Left and Lateral  PAIN TYPE: sharp Pain description: intermittent Aggravating factors: reaching, hand/wrist movement, grasping and carrying Relieving factors: rest, ice sleeve   PRECAUTIONS: None  WEIGHT BEARING RESTRICTIONS: No  FALLS:  Has patient fallen in last 6 months? 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, full time  PLOF: Independent  PATIENT GOALS: pain relief for good  OBJECTIVE:   DIAGNOSTIC FINDINGS:  none  PATIENT SURVEYS:  FOTO 48% goal 70%  COGNITION: Overall cognitive status: Within functional limits for tasks assessed     SENSATION: WFL  POSTURE: Rounded shoulders secondary to muscle bulk   UPPER EXTREMITY ROM:  WNL bil  UPPER EXTREMITY MMT: 5/5 bil shoulders and elbows, no pain on testing, 5/5 Rt wrist strength MMT Right eval Left eval  Wrist flexion  5/5 no pain  Wrist extension  4/5 with pain  Wrist ulnar deviation  5/5 no pain  Wrist radial deviation  5/5 no pain  Wrist pronation  5/5   Wrist supination  5/5  Grip strength (lbs) 187 104  (Blank rows = not tested)  SHOULDER SPECIAL TESTS:   JOINT MOBILITY TESTING:  WNL elbow and wrist bil  PALPATION:  Wrist extensors, lateral epicondyle on Lt   TODAY'S TREATMENT:                                                                                                                                                    DATE:   Trigger Point Dry-Needling  Treatment instructions: Expect mild to moderate muscle soreness. S/S of pneumothorax if dry needled over a lung field, and to seek immediate medical attention should they occur. Patient verbalized understanding of these instructions and education.  Patient Consent Given: Yes Education handout provided: Yes Muscles treated: Lt ECU, Lt ECRL Electrical stimulation performed: No Parameters: N/A Treatment response/outcome: deep ache with ECU, twitch and release with ECRL   PATIENT EDUCATION: Education details: DN info Person educated: Patient Education method: Programmer, multimedia, Facilities manager, Verbal cues, and Handouts Education comprehension: verbalized understanding  HOME EXERCISE PROGRAM: Aftercare for TPDN, start HEP next time  ASSESSMENT:  CLINICAL IMPRESSION: Patient is a 41 y.o. Rt hand dominant male who was seen today for physical therapy evaluation and treatment for Lt elbow pain with diagnosis of lateral epicondylitis. 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  He has a history of Rt elbow pain with cortisone treatments but MD wants him to try DN.  He has signif tenderness at lateral epicondyle and tenderness with TP present to extensor tendons and muscles of Lt forearm.  Pain on testing wrist ext strength 4/5 on Lt and diminished grip strength of 104lb on Lt (compared to 187lb on Rt).  DN initiated today.  Pt will benefit from skilled PT to address pain, strength, tissue health and functional deficits surrounding LT elbow  pain.   OBJECTIVE IMPAIRMENTS: decreased strength, increased muscle spasms, impaired flexibility, impaired UE functional use, postural dysfunction, and pain.   ACTIVITY LIMITATIONS: carrying, lifting, and sleeping  PARTICIPATION LIMITATIONS: cleaning, occupation, and yard work  PERSONAL FACTORS: Time since onset of injury/illness/exacerbation and 1 comorbidity: Hx of recurring Rt elbow lateral epicondylitis  are also affecting patient's functional outcome.   REHAB POTENTIAL: Excellent  CLINICAL DECISION MAKING: Stable/uncomplicated  EVALUATION COMPLEXITY: Low  GOALS: Goals reviewed with patient? Yes  SHORT TERM GOALS: Target date: 11/05/22  Pt will be ind with initial HEP and educated on TPDN technique and aftercare Baseline: Goal status: ongoing for TPDN  2.  Pt will report at least 20% reduction in pain with Lt UE use for light tasks Baseline:  Goal status: INITIAL  3.  Pt will sleep through night consistently without elbow pain disruption Baseline:  Goal status: INITIAL    LONG TERM GOALS: Target date: 12/03/22  Pt will be ind with advanced HEP and understand how to safely progress and prevent return of symptoms. Baseline:  Goal status: INITIAL  2.  FOTO score improved to at least 70% to demo improved function. Baseline: 48% Goal status: INITIAL  3.  Pt will achieve 5/5 strength of Lt wrist extensors without pain on testing to allow for improved Lt UE lifting and carrying. Baseline:  Goal status: INITIAL  4.  Pt will achieve at least 160lb grip strength to improve ability to perform work tasks for [place] set up/take down Baseline: 187lb Rt, 104lb Lt Goal status: INITIAL  5.  Pt will report at least 70% reduction in Lt elbow pain with daily tasks. Baseline:  Goal status: INITIAL    PLAN: PT FREQUENCY: 1-2x/week  PT DURATION: 8 weeks  PLANNED INTERVENTIONS: Therapeutic exercises, Therapeutic activity, Neuromuscular re-education,  Patient/Family education, Self Care, Joint mobilization, Dry Needling, Electrical stimulation, Spinal mobilization, Cryotherapy, Moist heat, Taping, Ionotophoresis 4mg /ml Dexamethasone, and Manual therapy  PLAN FOR NEXT SESSION: f/u on DN from eval, repeat and continue DN to Lt wrist extensors, check cervical for DN needs given has had bil epicondylitis, consider ionto to Lt epicondyle if cert signed, initiate HEP   Nimisha Rathel, PT 10/08/22 1:45 PM

## 2022-10-08 NOTE — Patient Instructions (Signed)

## 2022-10-23 ENCOUNTER — Encounter: Payer: Self-pay | Admitting: Rehabilitative and Restorative Service Providers"

## 2022-10-23 ENCOUNTER — Ambulatory Visit: Payer: 59 | Attending: Orthopaedic Surgery | Admitting: Rehabilitative and Restorative Service Providers"

## 2022-10-23 DIAGNOSIS — R252 Cramp and spasm: Secondary | ICD-10-CM | POA: Insufficient documentation

## 2022-10-23 DIAGNOSIS — M25522 Pain in left elbow: Secondary | ICD-10-CM | POA: Insufficient documentation

## 2022-10-23 NOTE — Therapy (Signed)
OUTPATIENT PHYSICAL THERAPY TREATMENT NOTE   Patient Name: Wayne Hart MRN: [id number] DOB:[birth year], 41 y.o., male Today's Date: 10/23/2022   PT End of Session - 10/23/22 0734     Visit Number 2    Date for PT Re-Evaluation 12/03/22    Authorization Type UMR    PT Start Time 0730    PT Stop Time 0800    PT Time Calculation (min) 30 min    Activity Tolerance Patient tolerated treatment well    Behavior During Therapy [facility] for tasks assessed/performed             Past Medical History:  Diagnosis Date   Allergy    seasonal   Past Surgical History:  Procedure Laterality Date   KNEE ARTHROSCOPY WITH EXCISION PLICA Right [id number]   Procedure: KNEE ARTHROSCOPY WITH EXCISION PLICA;  Surgeon: Leandrew Koyanagi, MD;  Location: [place];  Service: Orthopedics;  Laterality: Right;   KNEE ARTHROSCOPY WITH MEDIAL MENISECTOMY Right 08/06/2018   Procedure: RIGHT KNEE ARTHROSCOPY WITH PARTIAL MEDIAL MENISCECTOMY;  Surgeon: Leandrew Koyanagi, MD;  Location: [place];  Service: Orthopedics;  Laterality: Right;  injection of tennis elbow right 1cc marcaine .25 1cc depomedrol 40mg  and lidocaine 1 % 1cc  at 1230   ORIF CLAVICLE FRACTURE Right 03/02/2009   VASECTOMY  10/2015   Patient Active Problem List   Diagnosis Date Noted   Lateral epicondylitis, left elbow 10/07/2022   Vertigo 08/27/2021   Epistaxis, recurrent 06/27/2019   S/P right knee arthroscopy 08/19/2018   Acute medial meniscus tear of right knee    Plica of knee, right    Annual physical exam 05/18/2018   BACK PAIN 02/21/2011    PCP: Colon Branch, MD  REFERRING PROVIDER: Leandrew Koyanagi, MD   REFERRING DIAG: [phone] (ICD-10-CM) - Lateral epicondylitis, left elbow   THERAPY DIAG:  Pain in left elbow  Cramp and spasm  Rationale for Evaluation and Treatment Rehabilitation  ONSET DATE: 1.5 mos for Lt elbow (has had Rt elbow pain before)  SUBJECTIVE:                                                                                                                                                                                       SUBJECTIVE STATEMENT: Pt is unsure if the dry needling helped much, but he does state that he is sleeping better now.  PERTINENT HISTORY: Allergies Rt knee surgery 5 years ago for meniscus tear  PAIN:  PAIN:  Are you having pain? Yes NPRS scale: 4/10 Pain location: Lt elbow Pain orientation: Left and Lateral  PAIN TYPE: sharp Pain description: intermittent  Aggravating factors: reaching, hand/wrist movement, grasping and carrying Relieving factors: rest, ice sleeve   PRECAUTIONS: None  WEIGHT BEARING RESTRICTIONS: No  FALLS:  Has patient fallen in last 6 months? No  LIVING ENVIRONMENT: Lives with: lives with their family Lives in: House/apartment   OCCUPATION: Has a [employer], full time  PLOF: Independent  PATIENT GOALS: pain relief for good  OBJECTIVE:   DIAGNOSTIC FINDINGS:  none  PATIENT SURVEYS:  FOTO 48% goal 70%  COGNITION: Overall cognitive status: Within functional limits for tasks assessed     SENSATION: WFL  POSTURE: Rounded shoulders secondary to muscle bulk   UPPER EXTREMITY ROM:  WNL bil  UPPER EXTREMITY MMT: 5/5 bil shoulders and elbows, no pain on testing, 5/5 Rt wrist strength MMT Right eval Left eval  Wrist flexion  5/5 no pain  Wrist extension  4/5 with pain  Wrist ulnar deviation  5/5 no pain  Wrist radial deviation  5/5 no pain  Wrist pronation  5/5  Wrist supination  5/5  Grip strength (lbs) 187 104  (Blank rows = not tested)  SHOULDER SPECIAL TESTS:   JOINT MOBILITY TESTING:  WNL elbow and wrist bil  PALPATION:  Wrist extensors, lateral epicondyle on Lt   TODAY'S TREATMENT:  DATE 10/23/22: UBE level 1.0 x2 min each direction Median Nerve and Ulnar nerve stretch x10 Supination with 4# x10 LUE Trigger Point Dry-Needling  Treatment instructions: Expect mild to moderate  muscle soreness. S/S of pneumothorax if dry needled over a lung field, and to seek immediate medical attention should they occur. Patient verbalized understanding of these instructions and education. Patient Consent Given: Yes Education handout provided: Yes Muscles treated: Left wrist extensors Electrical stimulation performed: No Parameters: N/A Treatment response/outcome: Utilized skilled palpation to locate trigger points.  Able to palpate twitch response Manual Therapy:  soft tissue mobilization to left forearms and cross friction massage to area of lateral epicondyle Application of first iontopatch to left lateral elbow with 4mg /ml Dexamethasone with instructions to remove patch after 4 hours.                                                                                                                                     DATE:  Eval Trigger Point Dry-Needling  Treatment instructions: Expect mild to moderate muscle soreness. S/S of pneumothorax if dry needled over a lung field, and to seek immediate medical attention should they occur. Patient verbalized understanding of these instructions and education.  Patient Consent Given: Yes Education handout provided: Yes Muscles treated: Lt ECU, Lt ECRL Electrical stimulation performed: No Parameters: N/A Treatment response/outcome: deep ache with ECU, twitch and release with ECRL   PATIENT EDUCATION: Education details: DN info Person educated: Patient Education method: Consulting civil engineer, Media planner, Verbal cues, and Handouts Education comprehension: verbalized understanding  HOME EXERCISE PROGRAM: Access Code: [email or web address] URL: [email or web address] Date: 10/23/2022 Prepared by: Juel Burrow  Exercises - Wrist Prayer Stretch  -  1 x daily - 7 x weekly - 1 sets - 2 reps - 20 sec hold - Reverse Prayer Stretch  - 1 x daily - 7 x weekly - 1 sets - 2 reps - 20 sec hold - Standing Median Nerve Glide  - 1 x daily - 7 x weekly - 1  sets - 10 reps - Ulnar Nerve Flossing  - 1 x daily - 7 x weekly - 1 sets - 10 reps - Seated Forearm Pronation and Supination with Hammer  - 1 x daily - 7 x weekly - 2 sets - 10 reps  ASSESSMENT:  CLINICAL IMPRESSION: Mr Cassin presents to skilled PT reporting that he has been sleeping some better at night.  Pt with strong twitch response noted with dry needling to wrist extensors.  Pt with good response to manual therapy following dry needling and noted increased suppleness of muscles following.  Pt agreeable to iontopatch to assist with decreased pain.  Pt continues to require skilled PT to progress towards goal related activities.   OBJECTIVE IMPAIRMENTS: decreased strength, increased muscle spasms, impaired flexibility, impaired UE functional use, postural dysfunction, and pain.   ACTIVITY LIMITATIONS: carrying, lifting, and sleeping  PARTICIPATION LIMITATIONS: cleaning, occupation, and yard work  PERSONAL FACTORS: Time since onset of injury/illness/exacerbation and 1 comorbidity: Hx of recurring Rt elbow lateral epicondylitis  are also affecting patient's functional outcome.   REHAB POTENTIAL: Excellent  CLINICAL DECISION MAKING: Stable/uncomplicated  EVALUATION COMPLEXITY: Low  GOALS: Goals reviewed with patient? Yes  SHORT TERM GOALS: Target date: 11/05/22  Pt will be ind with initial HEP and educated on TPDN technique and aftercare Baseline: Goal status: ongoing for TPDN  2.  Pt will report at least 20% reduction in pain with Lt UE use for light tasks Baseline:  Goal status: INITIAL  3.  Pt will sleep through night consistently without elbow pain disruption Baseline:  Goal status: IN PROGRESS    LONG TERM GOALS: Target date: 12/03/22  Pt will be ind with advanced HEP and understand how to safely progress and prevent return of symptoms. Baseline:  Goal status: INITIAL  2.  FOTO score improved to at least 70% to demo improved function. Baseline: 48% Goal status:  INITIAL  3.  Pt will achieve 5/5 strength of Lt wrist extensors without pain on testing to allow for improved Lt UE lifting and carrying. Baseline:  Goal status: INITIAL  4.  Pt will achieve at least 160lb grip strength to improve ability to perform work tasks for [employer] set up/take down Baseline: 187lb Rt, 104lb Lt Goal status: INITIAL  5.  Pt will report at least 70% reduction in Lt elbow pain with daily tasks. Baseline:  Goal status: INITIAL    PLAN: PT FREQUENCY: 1-2x/week  PT DURATION: 8 weeks  PLANNED INTERVENTIONS: Therapeutic exercises, Therapeutic activity, Neuromuscular re-education, Patient/Family education, Self Care, Joint mobilization, Dry Needling, Electrical stimulation, Spinal mobilization, Cryotherapy, Moist heat, Taping, Ionotophoresis 4mg /ml Dexamethasone, and Manual therapy  PLAN FOR NEXT SESSION: Manual therapy/dry needling to Lt wrist extensors as indicated, iontopatch if indicated, strengthening/flexibility   Juel Burrow, PT 10/23/22 8:59 AM  [facility] [address] [place], [place] [address] Phone # [phone] Fax [phone]

## 2022-10-28 ENCOUNTER — Ambulatory Visit: Payer: 59 | Admitting: Physical Therapy

## 2022-10-28 ENCOUNTER — Ambulatory Visit: Payer: 59 | Admitting: Rehabilitative and Restorative Service Providers"

## 2022-10-28 ENCOUNTER — Encounter: Payer: Self-pay | Admitting: Physical Therapy

## 2022-10-28 DIAGNOSIS — R252 Cramp and spasm: Secondary | ICD-10-CM | POA: Diagnosis not present

## 2022-10-28 DIAGNOSIS — M25522 Pain in left elbow: Secondary | ICD-10-CM

## 2022-10-28 NOTE — Therapy (Signed)
OUTPATIENT PHYSICAL THERAPY TREATMENT NOTE   Patient Name: Wayne Hart MRN: [id number] DOB:1981-08-14, 41 y.o., male Today's Date: 10/28/2022   PT End of Session - 10/28/22 1017     Visit Number 3    Date for PT Re-Evaluation 12/03/22    Authorization Type UMR    PT Start Time 1017    PT Stop Time 1059    PT Time Calculation (min) 42 min    Activity Tolerance Patient tolerated treatment well    Behavior During Therapy WFL for tasks assessed/performed              Past Medical History:  Diagnosis Date   Allergy    seasonal   Past Surgical History:  Procedure Laterality Date   KNEE ARTHROSCOPY WITH EXCISION PLICA Right 08/06/2018   Procedure: KNEE ARTHROSCOPY WITH EXCISION PLICA;  Surgeon: Tarry Kos, MD;  Location: [facility];  Service: Orthopedics;  Laterality: Right;   KNEE ARTHROSCOPY WITH MEDIAL MENISECTOMY Right 08/06/2018   Procedure: RIGHT KNEE ARTHROSCOPY WITH PARTIAL MEDIAL MENISCECTOMY;  Surgeon: Tarry Kos, MD;  Location: [facility];  Service: Orthopedics;  Laterality: Right;  injection of tennis elbow right 1cc marcaine .25 1cc depomedrol 40mg  and lidocaine 1 % 1cc  at 1230   ORIF CLAVICLE FRACTURE Right 03/02/2009   VASECTOMY  10/2015   Patient Active Problem List   Diagnosis Date Noted   Lateral epicondylitis, left elbow 10/07/2022   Vertigo 08/27/2021   Epistaxis, recurrent 06/27/2019   S/P right knee arthroscopy 08/19/2018   Acute medial meniscus tear of right knee    Plica of knee, right    Annual physical exam 05/18/2018   BACK PAIN 02/21/2011    PCP: 04/23/2011, MD  REFERRING PROVIDER: Wanda Plump, MD   REFERRING DIAG: [phone] (ICD-10-CM) - Lateral epicondylitis, left elbow   THERAPY DIAG:  Pain in left elbow  Cramp and spasm  Rationale for Evaluation and Treatment Rehabilitation  ONSET DATE: 1.5 mos for Lt elbow (has had Rt elbow pain before)  SUBJECTIVE:                                                                                                                                                                                       SUBJECTIVE STATEMENT: No progress since last visit.  Today the elbow is really sore.  I think the patch helped.  PERTINENT HISTORY: Allergies Rt knee surgery 5 years ago for meniscus tear  PAIN:  PAIN:  Are you having pain? Yes NPRS scale: 6-7/10 Pain location: Lt elbow Pain orientation: Left and Lateral  PAIN TYPE: sharp Pain description: intermittent  Aggravating factors: reaching, hand/wrist movement, grasping and carrying Relieving factors: rest, ice sleeve   PRECAUTIONS: None  WEIGHT BEARING RESTRICTIONS: No  FALLS:  Has patient fallen in last 6 months? No  LIVING ENVIRONMENT: Lives with: lives with their family Lives in: House/apartment   OCCUPATION: Has a [employer], full time  PLOF: Independent  PATIENT GOALS: pain relief for good  OBJECTIVE:   DIAGNOSTIC FINDINGS:  none  PATIENT SURVEYS:  FOTO 48% goal 70%  COGNITION: Overall cognitive status: Within functional limits for tasks assessed     SENSATION: WFL  POSTURE: Rounded shoulders secondary to muscle bulk   UPPER EXTREMITY ROM:  WNL bil  UPPER EXTREMITY MMT: 5/5 bil shoulders and elbows, no pain on testing, 5/5 Rt wrist strength MMT Right eval Left eval  Wrist flexion  5/5 no pain  Wrist extension  4/5 with pain  Wrist ulnar deviation  5/5 no pain  Wrist radial deviation  5/5 no pain  Wrist pronation  5/5  Wrist supination  5/5  Grip strength (lbs) 187 104  (Blank rows = not tested)  SHOULDER SPECIAL TESTS:   JOINT MOBILITY TESTING:  WNL elbow and wrist bil  PALPATION:  Wrist extensors, lateral epicondyle on Lt   TODAY'S TREATMENT:  10/28/22: Trigger Point Dry-Needling  Treatment instructions: Expect mild to moderate muscle soreness. S/S of pneumothorax if dry needled over a lung field, and to seek immediate medical attention  should they occur. Patient verbalized understanding of these instructions and education.  Patient Consent Given: Yes Education handout provided: Previously provided Muscles treated: Lt ECRL, ECRB Electrical stimulation performed: No Parameters: N/A Treatment response/outcome: twitch, dull ache, release, improved tone STM wrist extensors 1lb dumbbell eccentric focus wrist extension, flexion, radial deviation, pron/sup 1x15 each Ionto #2 lateral epicondyle 4mg /ml Dexamethasone with instructions to remove patch after 4 hours. Pt education: ice cup massage x 2' daily to lateral epicondyle  DATE 10/23/22: UBE level 1.0 x2 min each direction Median Nerve and Ulnar nerve stretch x10 Supination with 4# x10 LUE Trigger Point Dry-Needling  Treatment instructions: Expect mild to moderate muscle soreness. S/S of pneumothorax if dry needled over a lung field, and to seek immediate medical attention should they occur. Patient verbalized understanding of these instructions and education. Patient Consent Given: Yes Education handout provided: Yes Muscles treated: Left wrist extensors Electrical stimulation performed: No Parameters: N/A Treatment response/outcome: Utilized skilled palpation to locate trigger points.  Able to palpate twitch response Manual Therapy:  soft tissue mobilization to left forearms and cross friction massage to area of lateral epicondyle Application of first iontopatch to left lateral elbow with 4mg /ml Dexamethasone with instructions to remove patch after 4 hours.                                                                                                                                     DATE:  Eval Trigger Point Dry-Needling  Treatment instructions: Expect  mild to moderate muscle soreness. S/S of pneumothorax if dry needled over a lung field, and to seek immediate medical attention should they occur. Patient verbalized understanding of these instructions and  education.  Patient Consent Given: Yes Education handout provided: Yes Muscles treated: Lt ECU, Lt ECRL Electrical stimulation performed: No Parameters: N/A Treatment response/outcome: deep ache with ECU, twitch and release with ECRL   PATIENT EDUCATION: Education details: DN info Person educated: Patient Education method: Programmer, multimedia, Facilities manager, Verbal cues, and Handouts Education comprehension: verbalized understanding  HOME EXERCISE PROGRAM: Access Code: [email or web address] URL: [email or web address] Date: 10/23/2022 Prepared by: Clydie Braun Menke  Exercises - Wrist Prayer Stretch  - 1 x daily - 7 x weekly - 1 sets - 2 reps - 20 sec hold - Reverse Prayer Stretch  - 1 x daily - 7 x weekly - 1 sets - 2 reps - 20 sec hold - Standing Median Nerve Glide  - 1 x daily - 7 x weekly - 1 sets - 10 reps - Ulnar Nerve Flossing  - 1 x daily - 7 x weekly - 1 sets - 10 reps - Seated Forearm Pronation and Supination with Hammer  - 1 x daily - 7 x weekly - 2 sets - 10 reps  ASSESSMENT:  CLINICAL IMPRESSION: Mr Strahle presents to skilled PT reporting that he has been sleeping some better at night.  Pt with strong twitch response noted with dry needling to wrist extensors.  Pt with good response to manual therapy following dry needling and noted increased suppleness of muscles following.  Pt agreeable to iontopatch to assist with decreased pain.  Pt continues to require skilled PT to progress towards goal related activities.   OBJECTIVE IMPAIRMENTS: decreased strength, increased muscle spasms, impaired flexibility, impaired UE functional use, postural dysfunction, and pain.   ACTIVITY LIMITATIONS: carrying, lifting, and sleeping  PARTICIPATION LIMITATIONS: cleaning, occupation, and yard work  PERSONAL FACTORS: Time since onset of injury/illness/exacerbation and 1 comorbidity: Hx of recurring Rt elbow lateral epicondylitis  are also affecting patient's functional outcome.   REHAB POTENTIAL:  Excellent  CLINICAL DECISION MAKING: Stable/uncomplicated  EVALUATION COMPLEXITY: Low  GOALS: Goals reviewed with patient? Yes  SHORT TERM GOALS: Target date: 11/05/22  Pt will be ind with initial HEP and educated on TPDN technique and aftercare Baseline: Goal status: ongoing for TPDN  2.  Pt will report at least 20% reduction in pain with Lt UE use for light tasks Baseline:  Goal status: INITIAL  3.  Pt will sleep through night consistently without elbow pain disruption Baseline:  Goal status: IN PROGRESS    LONG TERM GOALS: Target date: 12/03/22  Pt will be ind with advanced HEP and understand how to safely progress and prevent return of symptoms. Baseline:  Goal status: INITIAL  2.  FOTO score improved to at least 70% to demo improved function. Baseline: 48% Goal status: INITIAL  3.  Pt will achieve 5/5 strength of Lt wrist extensors without pain on testing to allow for improved Lt UE lifting and carrying. Baseline:  Goal status: INITIAL  4.  Pt will achieve at least 160lb grip strength to improve ability to perform work tasks for [employer]ing company set up/take down Baseline: 187lb Rt, 104lb Lt Goal status: INITIAL  5.  Pt will report at least 70% reduction in Lt elbow pain with daily tasks. Baseline:  Goal status: INITIAL    PLAN: PT FREQUENCY: 1-2x/week  PT DURATION: 8 weeks  PLANNED INTERVENTIONS: Therapeutic exercises, Therapeutic activity, Neuromuscular re-education, Patient/Family education,  Self Care, Joint mobilization, Dry Needling, Electrical stimulation, Spinal mobilization, Cryotherapy, Moist heat, Taping, Ionotophoresis 4mg /ml Dexamethasone, and Manual therapy  PLAN FOR NEXT SESSION: f/u on DN #3 and ionto #2, assess pain with supination with elbow bent, progress eccentric therex, continue manual techniques, ionto #3   , PT 10/28/22 11:01 AM   [facility] [address] [place],  [place] Phone # [phone] Fax [phone]

## 2022-11-04 ENCOUNTER — Encounter: Payer: 59 | Admitting: Physical Therapy

## 2022-11-06 ENCOUNTER — Encounter: Payer: 59 | Admitting: Rehabilitative and Restorative Service Providers"

## 2022-11-12 ENCOUNTER — Encounter: Payer: 59 | Admitting: Physical Therapy

## 2022-11-12 ENCOUNTER — Encounter: Payer: 59 | Admitting: Rehabilitative and Restorative Service Providers"

## 2022-11-18 ENCOUNTER — Ambulatory Visit: Payer: 59 | Admitting: Physical Therapy

## 2022-11-18 ENCOUNTER — Encounter: Payer: Self-pay | Admitting: Physical Therapy

## 2022-11-18 DIAGNOSIS — M25522 Pain in left elbow: Secondary | ICD-10-CM | POA: Diagnosis not present

## 2022-11-18 DIAGNOSIS — R252 Cramp and spasm: Secondary | ICD-10-CM

## 2022-11-18 NOTE — Therapy (Addendum)
OUTPATIENT PHYSICAL THERAPY TREATMENT NOTE   Patient Name: Wayne Hart MRN: [id number] DOB:1981/04/05, 41 y.o., male Today's Date: 11/18/2022   PT End of Session - 11/18/22 0851     Visit Number 4    Date for PT Re-Evaluation 12/03/22    Authorization Type UMR    PT Start Time 0846    PT Stop Time 0917    PT Time Calculation (min) 31 min    Activity Tolerance Patient tolerated treatment well    Behavior During Therapy [facility] for tasks assessed/performed               Past Medical History:  Diagnosis Date   Allergy    seasonal   Past Surgical History:  Procedure Laterality Date   KNEE ARTHROSCOPY WITH EXCISION PLICA Right 2/87/6811   Procedure: KNEE ARTHROSCOPY WITH EXCISION PLICA;  Surgeon: Leandrew Koyanagi, MD;  Location: [place];  Service: Orthopedics;  Laterality: Right;   KNEE ARTHROSCOPY WITH MEDIAL MENISECTOMY Right 08/06/2018   Procedure: RIGHT KNEE ARTHROSCOPY WITH PARTIAL MEDIAL MENISCECTOMY;  Surgeon: Leandrew Koyanagi, MD;  Location: [place];  Service: Orthopedics;  Laterality: Right;  injection of tennis elbow right 1cc marcaine .25 1cc depomedrol 61m and lidocaine 1 % 1cc  at 1230   ORIF CLAVICLE FRACTURE Right 03/02/2009   VASECTOMY  10/2015   Patient Active Problem List   Diagnosis Date Noted   Lateral epicondylitis, left elbow 10/07/2022   Vertigo 08/27/2021   Epistaxis, recurrent 06/27/2019   S/P right knee arthroscopy 08/19/2018   Acute medial meniscus tear of right knee    Plica of knee, right    Annual physical exam 05/18/2018   BACK PAIN 02/21/2011    PCP: PColon Branch MD  REFERRING PROVIDER: XLeandrew Koyanagi MD   REFERRING DIAG: [id number](ICD-10-CM) - Lateral epicondylitis, left elbow   THERAPY DIAG:  Pain in left elbow  Cramp and spasm  Rationale for Evaluation and Treatment Rehabilitation  ONSET DATE: 1.5 mos for Lt elbow (has had Rt elbow pain before)  SUBJECTIVE:                                                                                                                                                                                       SUBJECTIVE STATEMENT: I am no better.  There is no pattern of relief or pain. I feel like I have tried everything.    PERTINENT HISTORY: Allergies Rt knee surgery 5 years ago for meniscus tear  PAIN:  PAIN:  Are you having pain? Yes NPRS scale: 0-7/10 Pain location: Lt elbow Pain orientation: Left and Lateral  PAIN  TYPE: sharp Pain description: intermittent Aggravating factors: reaching, hand/wrist movement, grasping and carrying Relieving factors: rest, ice sleeve   PRECAUTIONS: None  WEIGHT BEARING RESTRICTIONS: No  FALLS:  Has patient fallen in last 6 months? No  LIVING ENVIRONMENT: Lives with: lives with their family Lives in: House/apartment   OCCUPATION: Has a [employer], full time  PLOF: Independent  PATIENT GOALS: pain relief for good  OBJECTIVE:   DIAGNOSTIC FINDINGS:  none  PATIENT SURVEYS:  FOTO: 11/18/22   FOTO 48% goal 70%  COGNITION: Overall cognitive status: Within functional limits for tasks assessed     SENSATION: WFL  POSTURE: Rounded shoulders secondary to muscle bulk   UPPER EXTREMITY ROM:  WNL bil  UPPER EXTREMITY MMT: 5/5 bil shoulders and elbows, no pain on testing, 5/5 Rt wrist strength MMT Right eval Left eval Left 11/18/22  Wrist flexion  5/5 no pain   Wrist extension  4/5 with pain 5/5 without pain  Wrist ulnar deviation  5/5 no pain   Wrist radial deviation  5/5 no pain   Wrist pronation  5/5   Wrist supination  5/5   Grip strength (lbs) 187 104 105lb  (Blank rows = not tested)  SHOULDER SPECIAL TESTS:   JOINT MOBILITY TESTING:  WNL elbow and wrist bil  PALPATION:  Wrist extensors, lateral epicondyle on Lt   TODAY'S TREATMENT:  11/18/22: UBE L3 3x3 PT present to review goals and status MMT and grip testing (see above) Wrist extensor stretch, supinator  stretch, pronator stretch, Lt 1x20" STM to wrist extensors, supinator, pronators PT advises Pt to continue with ice and return to MD for further work up due to no improvement with PT at this time.  10/28/22: Trigger Point Dry-Needling  Treatment instructions: Expect mild to moderate muscle soreness. S/S of pneumothorax if dry needled over a lung field, and to seek immediate medical attention should they occur. Patient verbalized understanding of these instructions and education.  Patient Consent Given: Yes Education handout provided: Previously provided Muscles treated: Lt ECRL, ECRB Electrical stimulation performed: No Parameters: N/A Treatment response/outcome: twitch, dull ache, release, improved tone STM wrist extensors 1lb dumbbell eccentric focus wrist extension, flexion, radial deviation, pron/sup 1x15 each Ionto #2 lateral epicondyle 92m/ml Dexamethasone with instructions to remove patch after 4 hours. Pt education: ice cup massage x 2' daily to lateral epicondyle  DATE 10/23/22: UBE level 1.0 x2 min each direction Median Nerve and Ulnar nerve stretch x10 Supination with 4# x10 LUE Trigger Point Dry-Needling  Treatment instructions: Expect mild to moderate muscle soreness. S/S of pneumothorax if dry needled over a lung field, and to seek immediate medical attention should they occur. Patient verbalized understanding of these instructions and education. Patient Consent Given: Yes Education handout provided: Yes Muscles treated: Left wrist extensors Electrical stimulation performed: No Parameters: N/A Treatment response/outcome: Utilized skilled palpation to locate trigger points.  Able to palpate twitch response Manual Therapy:  soft tissue mobilization to left forearms and cross friction massage to area of lateral epicondyle Application of first iontopatch to left lateral elbow with 452mml Dexamethasone with instructions to remove patch after 4 hours.  PATIENT EDUCATION: Education details: DN info Person educated: Patient Education method: Consulting civil engineer, Media planner, Verbal cues, and Handouts Education comprehension: verbalized understanding  HOME EXERCISE PROGRAM: Access Code: [email or web address] URL: [email or web address] Date: 10/23/2022 Prepared by: Shelby Dubin Menke  Exercises - Wrist Prayer Stretch  - 1 x daily - 7 x weekly - 1 sets - 2 reps - 20 sec hold - Reverse Prayer Stretch  - 1 x daily - 7 x weekly - 1 sets - 2 reps - 20 sec hold - Standing Median Nerve Glide  - 1 x daily - 7 x weekly - 1 sets - 10 reps - Ulnar Nerve Flossing  - 1 x daily - 7 x weekly - 1 sets - 10 reps - Seated Forearm Pronation and Supination with Hammer  - 1 x daily - 7 x weekly - 2 sets - 10 reps  ASSESSMENT:  CLINICAL IMPRESSION: Pt reports no change in pain in Lt elbow since starting PT.  PT has tried various manual techniques including STM and TPDN, stretches, modalities for pain and inflammation, rest, and HEP.  Pt does report he is now sleeping through the night without elbow pain disruption.  He continues to demo tenderness surrounding Lt elbow lateral epicondyle to light touch, suggesting ongoing inflammation.  He has signif tenderness along proximal aspect of wrist extensors and pronators with ongoing TP present despite DN.  He has 5/5 strength at elbow with pain if tested in outstretched position.  ROM is full with pain at end range supination and pronation.  Lt grip strength remains signif weak or inhibited compared to Rt.  Pt reports ongoing Lt arm fatigue and restricted use of Lt arm secondary to elbow pain. PT advises return to MD for further work up.   OBJECTIVE IMPAIRMENTS: decreased strength, increased muscle spasms, impaired flexibility, impaired UE functional use, postural dysfunction, and pain.   ACTIVITY LIMITATIONS: carrying,  lifting, and sleeping  PARTICIPATION LIMITATIONS: cleaning, occupation, and yard work  PERSONAL FACTORS: Time since onset of injury/illness/exacerbation and 1 comorbidity: Hx of recurring Rt elbow lateral epicondylitis  are also affecting patient's functional outcome.   REHAB POTENTIAL: Excellent  CLINICAL DECISION MAKING: Stable/uncomplicated  EVALUATION COMPLEXITY: Low  GOALS: Goals reviewed with patient? Yes  SHORT TERM GOALS: Target date: 11/05/22  Pt will be ind with initial HEP and educated on TPDN technique and aftercare Baseline: Goal status: met  2.  Pt will report at least 20% reduction in pain with Lt UE use for light tasks Baseline:  Goal status: not met  3.  Pt will sleep through night consistently without elbow pain disruption Baseline:  Goal status: met    LONG TERM GOALS: Target date: 12/03/22  Pt will be ind with advanced HEP and understand how to safely progress and prevent return of symptoms. Baseline:  Goal status: partially met  2.  FOTO score improved to at least 70% to demo improved function. Baseline: 48% Goal status:   3.  Pt will achieve 5/5 strength of Lt wrist extensors without pain on testing to allow for improved Lt UE lifting and carrying. Baseline:  Goal status: not met  4.  Pt will achieve at least 160lb grip strength to improve ability to perform work tasks for [place] set up/take down Baseline: 187lb Rt, 104lb Lt Goal status: not met, no change  5.  Pt will report at least 70% reduction in Lt elbow pain with daily tasks. Baseline:  Goal status: no change in pain, not met    PLAN: PT  FREQUENCY: 1-2x/week  PT DURATION: 8 weeks  PLANNED INTERVENTIONS: Therapeutic exercises, Therapeutic activity, Neuromuscular re-education, Patient/Family education, Self Care, Joint mobilization, Dry Needling, Electrical stimulation, Spinal mobilization, Cryotherapy, Moist heat, Taping, Ionotophoresis 79m/ml Dexamethasone, and Manual  therapy  PLAN FOR NEXT SESSION: cancelled future PT appts for now with plan to return to MD due to no improvement with PT to date, may follow up for more PT if MD recommends to continue after work up   [employer] PT 11/18/22 9:31 AM  PHYSICAL THERAPY DISCHARGE SUMMARY  Visits from Start of Care: 4  Current functional level related to goals / functional outcomes: Pt felt none of the interventions were providing any initial relief to elbow pain.  Was going to seek MD visit to determine other steps.  Did not return within cert period.     Remaining deficits: See above   Education / Equipment: HEP and modalities   Patient agrees to discharge. Patient goals were not met. Patient is being discharged due to not returning since the last visit.  JBaruch Merl PT 12/24/22 10:38 AM    [facility] [address] SHollandaleGRowena [place] [address]Phone # [phone] [phone]

## 2022-11-19 ENCOUNTER — Encounter: Payer: 59 | Admitting: Rehabilitative and Restorative Service Providers"

## 2022-11-25 ENCOUNTER — Encounter: Payer: 59 | Admitting: Physical Therapy

## 2022-11-27 ENCOUNTER — Encounter: Payer: 59 | Admitting: Rehabilitative and Restorative Service Providers"

## 2022-12-02 ENCOUNTER — Encounter: Payer: 59 | Admitting: Rehabilitative and Restorative Service Providers"

## 2022-12-02 ENCOUNTER — Ambulatory Visit: Payer: 59 | Admitting: Physical Therapy

## 2023-05-11 ENCOUNTER — Encounter: Payer: Self-pay | Admitting: Internal Medicine

## 2023-05-11 ENCOUNTER — Encounter: Payer: Commercial Managed Care - PPO | Admitting: Internal Medicine

## 2023-05-11 ENCOUNTER — Ambulatory Visit (INDEPENDENT_AMBULATORY_CARE_PROVIDER_SITE_OTHER): Payer: 59 | Admitting: Internal Medicine

## 2023-05-11 VITALS — BP 122/64 | HR 47 | Temp 98.0°F | Resp 16 | Ht 76.0 in | Wt 293.5 lb

## 2023-05-11 DIAGNOSIS — Z0000 Encounter for general adult medical examination without abnormal findings: Secondary | ICD-10-CM

## 2023-05-11 NOTE — Progress Notes (Unsigned)
Subjective:    Patient ID: Wayne Hart, male    DOB: [birth year], 42 y.o.   MRN: [id number]  DOS:  05/11/2023 Type of visit - description: cpx  Since the last office visit is doing well Had tennis elbow but that eventually got better. He has changed his diet for the last few weeks, + weight loss.  Wt Readings from Last 3 Encounters:  05/11/23 293 lb 8 oz (133.1 kg)  03/20/22 (!) 312 lb (141.5 kg)  01/02/22 293 lb 6.9 oz (133.1 kg)     Review of Systems  Other than above, a 14 point review of systems is negative    Past Medical History:  Diagnosis Date   Allergy    seasonal    Past Surgical History:  Procedure Laterality Date   KNEE ARTHROSCOPY WITH EXCISION PLICA Right 08/06/2018   Procedure: KNEE ARTHROSCOPY WITH EXCISION PLICA;  Surgeon: Tarry Kos, MD;  Location: [facility];  Service: Orthopedics;  Laterality: Right;   KNEE ARTHROSCOPY WITH MEDIAL MENISECTOMY Right 08/06/2018   Procedure: RIGHT KNEE ARTHROSCOPY WITH PARTIAL MEDIAL MENISCECTOMY;  Surgeon: Tarry Kos, MD;  Location: [facility];  Service: Orthopedics;  Laterality: Right;  injection of tennis elbow right 1cc marcaine .25 1cc depomedrol 40mg  and lidocaine 1 % 1cc  at 1230   ORIF CLAVICLE FRACTURE Right 03/02/2009   VASECTOMY  10/2015   Social History   Socioeconomic History   Marital status: Married    Spouse name: Not on file   Number of children: 2   Years of education: Not on file   Highest education level: Not on file  Occupational History   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food industry  Tobacco Use   Smoking status: Some Days    Types: Cigars   Smokeless tobacco: Never   Tobacco comments:    occ cigar  Vaping Use   Vaping Use: Never used  Substance and Sexual Activity   Alcohol use: Yes    Comment: occasionally   Drug use: Never   Sexual activity: Not on file  Other Topics Concern   Not on file  Social History Narrative   2016 boy    2014 girl    Social  Determinants of Corporate investment banker Strain: Not on file  Food Insecurity: Not on file  Transportation Needs: Not on file  Physical Activity: Not on file  Stress: Not on file  Social Connections: Not on file  Intimate Partner Violence: Not on file    No current outpatient medications      Objective:   Physical Exam BP 122/64   Pulse (!) 47   Temp 98 F (36.7 C) (Oral)   Resp 16   Ht 6\' 4"  (1.93 m)   Wt 293 lb 8 oz (133.1 kg)   SpO2 99%   BMI 35.73 kg/m  General: Well developed, NAD, BMI noted Neck: No  thyromegaly  HEENT:  Normocephalic . Face symmetric, atraumatic Lungs:  CTA B Normal respiratory effort, no intercostal retractions, no accessory muscle use. Heart: RRR,  no murmur.  Abdomen:  Not distended, soft, non-tender. No rebound or rigidity.   Lower extremities: no pretibial edema bilaterally  Skin: Exposed areas without rash. Not pale. Not jaundice Neurologic:  alert & oriented X3.  Speech normal, gait appropriate for age and unassisted Strength symmetric and appropriate for age.  Psych: Cognition and judgment appear intact.  Cooperative with normal attention span and concentration.  Behavior appropriate. No  anxious or depressed appearing.     Assessment    ASSESSMENT Overweight  PLAN  Here for CPX Overweight: 6 weeks ago  changed his diet, eating less carbohydrates, more meats and some more fats.  + Weight loss.  RTC 1 year

## 2023-05-11 NOTE — Patient Instructions (Addendum)
It was good to see you today.   GO TO THE LAB : Get the blood work     GO TO THE FRONT DESK, PLEASE SCHEDULE YOUR APPOINTMENTS Come back for a physical exam in 1 year 

## 2023-05-12 ENCOUNTER — Encounter: Payer: Self-pay | Admitting: Internal Medicine

## 2023-05-12 DIAGNOSIS — Z09 Encounter for follow-up examination after completed treatment for conditions other than malignant neoplasm: Secondary | ICD-10-CM | POA: Insufficient documentation

## 2023-05-12 LAB — CBC WITH DIFFERENTIAL/PLATELET
Basophils Absolute: 0.1 10*3/uL (ref 0.0–0.1)
Basophils Relative: 1.2 % (ref 0.0–3.0)
Eosinophils Absolute: 0.2 10*3/uL (ref 0.0–0.7)
Eosinophils Relative: 2.8 % (ref 0.0–5.0)
HCT: 43.2 % (ref 39.0–52.0)
Hemoglobin: 14.8 g/dL (ref 13.0–17.0)
Lymphocytes Relative: 35.6 % (ref 12.0–46.0)
Lymphs Abs: 2.6 10*3/uL (ref 0.7–4.0)
MCHC: 34.3 g/dL (ref 30.0–36.0)
MCV: 89.7 fl (ref 78.0–100.0)
Monocytes Absolute: 0.6 10*3/uL (ref 0.1–1.0)
Monocytes Relative: 7.5 % (ref 3.0–12.0)
Neutro Abs: 3.9 10*3/uL (ref 1.4–7.7)
Neutrophils Relative %: 52.9 % (ref 43.0–77.0)
Platelets: 222 10*3/uL (ref 150.0–400.0)
RBC: 4.82 Mil/uL (ref 4.22–5.81)
RDW: 13.2 % (ref 11.5–15.5)
WBC: 7.3 10*3/uL (ref 4.0–10.5)

## 2023-05-12 LAB — LIPID PANEL
Cholesterol: 173 mg/dL (ref 0–200)
HDL: 48.9 mg/dL (ref >39.00)
LDL Cholesterol: 95 mg/dL (ref 0–99)
NonHDL: 124.2
Total CHOL/HDL Ratio: 4
Triglycerides: 147 mg/dL (ref 0.0–149.0)
VLDL: 29.4 mg/dL (ref 0.0–40.0)

## 2023-05-12 LAB — COMPREHENSIVE METABOLIC PANEL
ALT: 22 U/L (ref 0–53)
AST: 21 U/L (ref 0–37)
Albumin: 4.3 g/dL (ref 3.5–5.2)
Alkaline Phosphatase: 64 U/L (ref 39–117)
BUN: 19 mg/dL (ref 6–23)
CO2: 28 mEq/L (ref 19–32)
Calcium: 9.4 mg/dL (ref 8.4–10.5)
Chloride: 102 mEq/L (ref 96–112)
Creatinine, Ser: 1.07 mg/dL (ref 0.40–1.50)
GFR: 86.55 mL/min (ref >60.00)
Glucose, Bld: 84 mg/dL (ref 70–99)
Potassium: 4.1 mEq/L (ref 3.5–5.1)
Sodium: 138 mEq/L (ref 135–145)
Total Bilirubin: 0.8 mg/dL (ref 0.2–1.2)
Total Protein: 7 g/dL (ref 6.0–8.3)

## 2023-05-12 NOTE — Assessment & Plan Note (Signed)
-   Td 2022 -COVID booster if not done by 08-2022. -Flu shot seasonally  -No FH of prostate or colon cancer -Labs: Reviewed last year's labs, will check a CMP CBC FLP -Lifestyle: Eating healthier, less carbohydrates, inevitably he is eating more fats.  He remains active.  Further dietary advice depending on results.

## 2023-05-12 NOTE — Assessment & Plan Note (Signed)
Here for CPX Overweight: 6 weeks ago  changed his diet, eating less carbohydrates, more meats and some more fats.  + Weight loss.  RTC 1 year

## 2023-05-19 ENCOUNTER — Encounter: Payer: Self-pay | Admitting: Internal Medicine

## 2023-05-20 ENCOUNTER — Ambulatory Visit: Payer: 59 | Admitting: Family Medicine

## 2023-05-20 ENCOUNTER — Encounter: Payer: Commercial Managed Care - PPO | Admitting: Internal Medicine

## 2023-05-20 ENCOUNTER — Emergency Department (HOSPITAL_COMMUNITY)
Admission: EM | Admit: 2023-05-20 | Discharge: 2023-05-20 | Disposition: A | Discharge status: Home / Self Care | Payer: 59 | Attending: Emergency Medicine | Admitting: Emergency Medicine

## 2023-05-20 ENCOUNTER — Emergency Department (HOSPITAL_COMMUNITY): Payer: 59

## 2023-05-20 ENCOUNTER — Encounter: Payer: Self-pay | Admitting: Family Medicine

## 2023-05-20 ENCOUNTER — Other Ambulatory Visit: Payer: Self-pay

## 2023-05-20 ENCOUNTER — Encounter (HOSPITAL_COMMUNITY): Payer: Self-pay

## 2023-05-20 VITALS — BP 130/84 | HR 54 | Temp 97.9°F | Resp 18 | Ht 76.0 in | Wt 291.4 lb

## 2023-05-20 DIAGNOSIS — K429 Umbilical hernia without obstruction or gangrene: Secondary | ICD-10-CM | POA: Diagnosis not present

## 2023-05-20 DIAGNOSIS — R109 Unspecified abdominal pain: Secondary | ICD-10-CM | POA: Diagnosis not present

## 2023-05-20 DIAGNOSIS — L03316 Cellulitis of umbilicus: Secondary | ICD-10-CM | POA: Diagnosis not present

## 2023-05-20 LAB — CBC WITH DIFFERENTIAL/PLATELET
Abs Immature Granulocytes: 0.03 10*3/uL (ref 0.00–0.07)
Basophils Absolute: 0 10*3/uL (ref 0.0–0.1)
Basophils Relative: 0 %
Eosinophils Absolute: 0.3 10*3/uL (ref 0.0–0.5)
Eosinophils Relative: 3 %
HCT: 42.7 % (ref 39.0–52.0)
Hemoglobin: 15.3 g/dL (ref 13.0–17.0)
Immature Granulocytes: 0 %
Lymphocytes Relative: 29 %
Lymphs Abs: 2.5 10*3/uL (ref 0.7–4.0)
MCH: 31.4 pg (ref 26.0–34.0)
MCHC: 35.8 g/dL (ref 30.0–36.0)
MCV: 87.5 fL (ref 80.0–100.0)
Monocytes Absolute: 0.6 10*3/uL (ref 0.1–1.0)
Monocytes Relative: 7 %
Neutro Abs: 5.2 10*3/uL (ref 1.7–7.7)
Neutrophils Relative %: 61 %
Platelets: 200 10*3/uL (ref 150–400)
RBC: 4.88 MIL/uL (ref 4.22–5.81)
RDW: 12.4 % (ref 11.5–15.5)
WBC: 8.7 10*3/uL (ref 4.0–10.5)
nRBC: 0 % (ref 0.0–0.2)

## 2023-05-20 LAB — COMPREHENSIVE METABOLIC PANEL
ALT: 28 U/L (ref 0–44)
AST: 22 U/L (ref 15–41)
Albumin: 4.1 g/dL (ref 3.5–5.0)
Alkaline Phosphatase: 76 U/L (ref 38–126)
Anion gap: 10 (ref 5–15)
BUN: 18 mg/dL (ref 6–20)
CO2: 22 mmol/L (ref 22–32)
Calcium: 9 mg/dL (ref 8.9–10.3)
Chloride: 107 mmol/L (ref 98–111)
Creatinine, Ser: 1.07 mg/dL (ref 0.61–1.24)
GFR, Estimated: >60 mL/min (ref >60)
Glucose, Bld: 94 mg/dL (ref 70–99)
Potassium: 3.7 mmol/L (ref 3.5–5.1)
Sodium: 139 mmol/L (ref 135–145)
Total Bilirubin: 0.7 mg/dL (ref 0.3–1.2)
Total Protein: 7.7 g/dL (ref 6.5–8.1)

## 2023-05-20 MED ORDER — CEPHALEXIN 500 MG PO CAPS
500.0000 mg | ORAL_CAPSULE | Freq: Once | ORAL | Status: AC
  Administered 2023-05-20: 500 mg via ORAL
  Filled 2023-05-20: qty 1

## 2023-05-20 MED ORDER — SODIUM CHLORIDE (PF) 0.9 % IJ SOLN
INTRAMUSCULAR | Status: AC
  Filled 2023-05-20: qty 50

## 2023-05-20 MED ORDER — IOHEXOL 300 MG/ML  SOLN
100.0000 mL | Freq: Once | INTRAMUSCULAR | Status: AC | PRN
  Administered 2023-05-20: 100 mL via INTRAVENOUS

## 2023-05-20 MED ORDER — CEPHALEXIN 500 MG PO CAPS: 500.0000 mg | ORAL_CAPSULE | Freq: Four times a day (QID) | ORAL | 0 refills | Status: DC

## 2023-05-20 NOTE — Progress Notes (Signed)
   Subjective:    Patient ID: Wayne Hart, male    DOB: 01-24-1981, 42 y.o.   MRN: [id number]  HPI Hernia- 'my wife says she thinks I have a hernia'.  Yesterday felt like a 'stomach cramp' for most of the day.  Pain worsened throughout the day.  Noticed a knot around his belly button.  Area is sore to touch.  Has never had lump or issue there before.  Denies 'bad nausea' but stomach is 'unsettled'.     Review of Systems For ROS see HPI     Objective:   Physical Exam Vitals reviewed.  Constitutional:      General: He is not in acute distress.    Appearance: Normal appearance. He is obese. He is not ill-appearing.  HENT:     Head: Normocephalic and atraumatic.  Pulmonary:     Effort: Pulmonary effort is normal. No respiratory distress.  Abdominal:     General: There is no distension.     Palpations: Abdomen is soft.     Tenderness: There is abdominal tenderness (mild TTP over umbilicus). There is no guarding or rebound.     Hernia: A hernia (small umbilical hernia palpated) is present.  Skin:    General: Skin is warm and dry.     Findings: No erythema.  Neurological:     General: No focal deficit present.     Mental Status: He is alert and oriented to person, place, and time.  Psychiatric:        Mood and Affect: Mood normal.        Behavior: Behavior normal.        Thought Content: Thought content normal.           Assessment & Plan:  Umbilical hernia- new.  No evidence of cellulitis or incarceration.  Pt is uncomfortable and feels better when lying down.  Reviewed that this is bc gravity works and things will settle away from the defect in the muscle wall when lying down.  Reviewed red flags of strangulation/incarceration that should prompt immediate return.  Will place surgical referral for prompt evaluation and tx.  Pt expressed understanding and is in agreement w/ plan.

## 2023-05-20 NOTE — Patient Instructions (Signed)
Follow up as needed or as scheduled We'll call you to schedule your appt w/ [facility] Surgery Ice as needed Try and avoid heavy lifting, pushing/pulling If area is painful- try and relax and lie down Call with any questions or concerns Hang in there!!!

## 2023-05-20 NOTE — Telephone Encounter (Signed)
Appt today with Dr. Beverely Low

## 2023-05-20 NOTE — ED Triage Notes (Signed)
Went to PCP this AM for possible hernia that popped up under umbilicus yesterday. Was supposed to see a surgeon for possible repair, but has since developed periumbilical redness, warmth and bloody drainage coming from umbilicus. Denies fevers/chills. C/o some localized pain to area. Took 600 mg ibuprofen around 1800.

## 2023-05-20 NOTE — Discharge Instructions (Addendum)
You you have been seen and discharged from the emergency department.  Your CAT scan showed superficial cellulitis of the umbilicus.  Take antibiotics as directed.  You may call the surgery office and let them know that your symptoms resulted in an emergency room visit.  That you developed cellulitis of the umbilicus secondary to what is believed to be an umbilical hernia.  Follow-up with your primary provider for further evaluation and further care. Take home medications as prescribed. If you have any worsening symptoms or further concerns for your health please return to an emergency department for further evaluation.

## 2023-05-21 NOTE — ED Provider Notes (Addendum)
[place] EMERGENCY DEPARTMENT AT [facility] Provider Note   CSN: [id number] Arrival date & time: 05/20/23  2049     History  Chief Complaint  Patient presents with   Abdominal Pain   Skin Problem    Wayne Hart is a 42 y.o. male.  HPI   42 year old male presents emergency department with umbilical swelling, pain and now overlying skin changes.  2 days ago patient noted a palpable mass around the umbilicus that became tender.  Was seen by primary today.  Diagnosed with what appears to be umbilical hernia, referred to surgery as an outpatient.  Presents tonight with worsening pain and now discharge from the umbilicus with surrounding redness of the skin.  Pain and palpable mass is worse when standing, improves with laying flat.  No associated fever.  No constipation, diarrhea, vomiting.  Home Medications Prior to Admission medications   Medication Sig Start Date End Date Taking? Authorizing Provider  cephALEXin (KEFLEX) 500 MG capsule Take 1 capsule (500 mg total) by mouth 4 (four) times daily. 05/20/23  Yes Chinedum Vanhouten, Clabe Seal, DO      Allergies    Patient has no known allergies.    Review of Systems   Review of Systems  Constitutional:  Negative for fever.  Respiratory:  Negative for shortness of breath.   Cardiovascular:  Negative for chest pain.  Gastrointestinal:  Positive for abdominal distention and abdominal pain. Negative for blood in stool, constipation, diarrhea and vomiting.  Skin:  Positive for color change and rash.  Neurological:  Negative for headaches.    Physical Exam Updated Vital Signs BP 136/75   Pulse 64   Temp 98.7 F (37.1 C) (Oral)   Resp 18   Ht 6\' 4"  (1.93 m)   Wt 132 kg   SpO2 99%   BMI 35.42 kg/m  Physical Exam Vitals and nursing note reviewed.  Constitutional:      Appearance: Normal appearance. He is not ill-appearing.  HENT:     Head: Normocephalic.     Mouth/Throat:     Mouth: Mucous membranes are moist.   Cardiovascular:     Rate and Rhythm: Normal rate.  Pulmonary:     Effort: Pulmonary effort is normal. No respiratory distress.  Abdominal:     General: There is no distension.     Palpations: Abdomen is soft.     Tenderness: There is abdominal tenderness in the periumbilical area. There is no guarding.     Hernia: A hernia is present. Hernia is present in the umbilical area.     Comments: Palpable umbilical mass/hernia, worse when standing, TTP, overlying redness around umbilicus with bloody discharge from  umbilicus  Skin:    General: Skin is warm.  Neurological:     Mental Status: He is alert and oriented to person, place, and time. Mental status is at baseline.  Psychiatric:        Mood and Affect: Mood normal.     ED Results / Procedures / Treatments   Labs (all labs ordered are listed, but only abnormal results are displayed) Labs Reviewed  COMPREHENSIVE METABOLIC PANEL  CBC WITH DIFFERENTIAL/PLATELET    EKG None  Radiology CT ABDOMEN PELVIS W CONTRAST  Result Date: 05/20/2023 CLINICAL DATA:  Abdominal pain.  Periumbilical redness, drainage EXAM: CT ABDOMEN AND PELVIS WITH CONTRAST TECHNIQUE: Multidetector CT imaging of the abdomen and pelvis was performed using the standard protocol following bolus administration of intravenous contrast. RADIATION DOSE REDUCTION: This exam was  performed according to the departmental dose-optimization program which includes automated exposure control, adjustment of the mA and/or kV according to patient size and/or use of iterative reconstruction technique. CONTRAST:  OMNIPAQUE IOHEXOL 300 MG/ML  SOLN COMPARISON:  None Available. FINDINGS: Lower chest: No acute abnormality Hepatobiliary: No focal hepatic abnormality. Gallbladder unremarkable. Pancreas: No focal abnormality or ductal dilatation. Spleen: No focal abnormality.  Normal size. Adrenals/Urinary Tract: No adrenal abnormality. No focal renal abnormality. No stones or  hydronephrosis. Urinary bladder is unremarkable. Stomach/Bowel: Normal appendix. Stomach, large and small bowel grossly unremarkable. Vascular/Lymphatic: No evidence of aneurysm or adenopathy. Reproductive: No visible focal abnormality. Other: No free fluid or free air. Soft tissue thickening noted in the region of the umbilicus. No focal fluid collection. Musculoskeletal: No acute bony abnormality. IMPRESSION: Soft tissue thickening noted in the region of the umbilicus and surrounding subcutaneous soft tissues. This could reflect cellulitis. No focal fluid collection. No acute intra-abdominal abnormality. Electronically Signed   By: Charlett Nose M.D.   On: 05/20/2023 23:18    Procedures Procedures    Medications Ordered in ED Medications  iohexol (OMNIPAQUE) 300 MG/ML solution 100 mL (100 mLs Intravenous Contrast Given 05/20/23 2257)  cephALEXin (KEFLEX) capsule 500 mg (500 mg Oral Given 05/20/23 2350)    ED Course/ Medical Decision Making/ A&P                             Medical Decision Making Amount and/or Complexity of Data Reviewed Labs: ordered. Radiology: ordered.  Risk Prescription drug management.   42 year old male presents emergency department with palpable umbilical swelling, tenderness and overlying skin changes with discharge from the umbilicus.  No report of fever or GI changes, no symptoms of obstruction.  Vital signs are normal and stable.  On exam he has a palpable umbilical mass that is worse with standing and bearing down.  Tender to palpation.  There is serosanguineous discharge from the umbilicus with overlying erythema of the skin surrounding the umbilicus.  Blood work is reassuring and normal.  CT performed for concern of incarcerated/strangulated hernia.  CT shows skin changes in regards to the umbilicus consistent with cellulitis.  No findings of strangulated hernia.  Will plan to treat with oral antibiotics and proceed with outpatient surgery follow-up.  Signs  and symptoms of incarcerated/strangulated hernia discussed.  Patient at this time appears safe and stable for discharge and close outpatient follow up. Discharge plan and strict return to ED precautions discussed, patient verbalizes understanding and agreement.        Final Clinical Impression(s) / ED Diagnoses Final diagnoses:  Cellulitis of umbilicus    Rx / DC Orders ED Discharge Orders          Ordered    cephALEXin (KEFLEX) 500 MG capsule  4 times daily        05/20/23 2341              Nimai Burbach, Clabe Seal, DO 05/21/23 0017    Michal Strzelecki, Clabe Seal, DO 05/21/23 1544

## 2023-05-25 DIAGNOSIS — S30851A Superficial foreign body of abdominal wall, initial encounter: Secondary | ICD-10-CM | POA: Diagnosis not present

## 2023-05-25 DIAGNOSIS — L089 Local infection of the skin and subcutaneous tissue, unspecified: Secondary | ICD-10-CM | POA: Diagnosis not present

## 2023-09-29 NOTE — Progress Notes (Unsigned)
[facility] [address] [place] [address] Phone: [phone] Subjective:   Bruce Donath, am serving as a scribe for Dr. Antoine Primas.  I'm seeing this patient by the request  of:  Wanda Plump, MD  CC: low back pain follow up, wrist pain   [id number]  Wayne Hart is a 42 y.o. male coming in with complaint of LBP and R wrist pain. Patient states that his lower back pain started 2 months ago. Painful to be seated. Pain radiates into his hip. He drives to [employer] for work and will have to get out of his car to alleviate his pain. More R sided.   Pain in R wrist with loaded extension. Pain occurring for past month. Insidious onset.     Past Medical History:  Diagnosis Date   Allergy    seasonal   Past Surgical History:  Procedure Laterality Date   KNEE ARTHROSCOPY WITH EXCISION PLICA Right 08/06/2018   Procedure: KNEE ARTHROSCOPY WITH EXCISION PLICA;  Surgeon: Tarry Kos, MD;  Location: [facility];  Service: Orthopedics;  Laterality: Right;   KNEE ARTHROSCOPY WITH MEDIAL MENISECTOMY Right 08/06/2018   Procedure: RIGHT KNEE ARTHROSCOPY WITH PARTIAL MEDIAL MENISCECTOMY;  Surgeon: Tarry Kos, MD;  Location: [facility];  Service: Orthopedics;  Laterality: Right;  injection of tennis elbow right 1cc marcaine .25 1cc depomedrol 40mg  and lidocaine 1 % 1cc  at 1230   ORIF CLAVICLE FRACTURE Right 03/02/2009   VASECTOMY  10/2015   Social History   Socioeconomic History   Marital status: Married    Spouse name: Not on file   Number of children: 2   Years of education: Not on file   Highest education level: Bachelor's degree (e.g., BA, AB, BS)  Occupational History   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Banker  Tobacco Use   Smoking status: Some Days    Types: Cigars   Smokeless tobacco: Never   Tobacco comments:    occ cigar  Vaping Use   Vaping status: Never Used  Substance and Sexual Activity   Alcohol  use: Yes    Comment: occasionally   Drug use: Never   Sexual activity: Not on file  Other Topics Concern   Not on file  Social History Narrative   2016 boy    2014 girl    Social Determinants of Health   Financial Resource Strain: Low Risk  (05/20/2023)   Overall Financial Resource Strain (CARDIA)    Difficulty of Paying Living Expenses: Not hard at all  Food Insecurity: No Food Insecurity (05/20/2023)   Hunger Vital Sign    Worried About Running Out of Food in the Last Year: Never true    Ran Out of Food in the Last Year: Never true  Transportation Needs: No Transportation Needs (05/20/2023)   PRAPARE - Administrator, Civil Service (Medical): No    Lack of Transportation (Non-Medical): No  Physical Activity: Sufficiently Active (05/20/2023)   Exercise Vital Sign    Days of Exercise per Week: 3 days    Minutes of Exercise per Session: 80 min  Stress: No Stress Concern Present (05/20/2023)   [employer] of Occupational Health - Occupational Stress Questionnaire    Feeling of Stress : Not at all  Social Connections: Socially Integrated (05/20/2023)   Social Connection and Isolation Panel [NHANES]    Frequency of Communication with Friends and Family: More than three times a week  Frequency of Social Gatherings with Friends and Family: More than three times a week    Attends Religious Services: More than 4 times per year    Active Member of Clubs or Organizations: Yes    Attends Engineer, structural: More than 4 times per year    Marital Status: Married   No Known Allergies Family History  Problem Relation Age of Onset   Myelodysplastic syndrome Father 33   Diabetes Maternal Grandmother    Prostate cancer Neg Hx    Colon cancer Neg Hx    CAD Neg Hx     Current Outpatient Medications (Endocrine & Metabolic):    predniSONE (DELTASONE) 50 MG tablet, Take one tablet daily for the next 5 days.      Current Outpatient Medications (Other):     gabapentin (NEURONTIN) 100 MG capsule, Take 2 capsules (200 mg total) by mouth at bedtime.   Reviewed prior external information including notes and imaging from  primary care provider As well as notes that were available from care everywhere and other healthcare systems.  Past medical history, social, surgical and family history all reviewed in electronic medical record.  No pertanent information unless stated regarding to the chief complaint.   Review of Systems:  No headache, visual changes, nausea, vomiting, diarrhea, constipation, dizziness, abdominal pain, skin rash, fevers, chills, night sweats, weight loss, swollen lymph nodes, body aches, joint swelling, chest pain, shortness of breath, mood changes. POSITIVE muscle aches  Objective  Blood pressure 112/74, pulse 60, height 6\' 4"  (1.93 m), weight 284 lb (128.8 kg), SpO2 96%.   General: No apparent distress alert and oriented x3 mood and affect normal, dressed appropriately.  HEENT: Pupils equal, extraocular movements intact  Respiratory: Patient's speak in full sentences and does not appear short of breath  Cardiovascular: No lower extremity edema, non tender, no erythema  Back exam shows does have some loss lordosis noted.  Positive straight leg test noted to have 20 degrees of forward flexion on the right side.  Wrist exam shows right wrist does have a positive Watson's are noted.  Worsening pain with extension noted.  Limited muscular skeletal ultrasound was performed and interpreted by Antoine Primas, M  Limited ultrasound does show that there is hypoechoic changes of the TFCC noted with some increasing in Doppler flow.  No significant arthritic changes.  Scaphoid ligament ligament seems to have some laxity.     Impression and Recommendations:    The above documentation has been reviewed and is accurate and complete Judi Saa, DO

## 2023-09-30 ENCOUNTER — Other Ambulatory Visit: Payer: Self-pay

## 2023-09-30 ENCOUNTER — Ambulatory Visit (INDEPENDENT_AMBULATORY_CARE_PROVIDER_SITE_OTHER): Payer: 59

## 2023-09-30 ENCOUNTER — Ambulatory Visit: Payer: 59 | Admitting: Family Medicine

## 2023-09-30 ENCOUNTER — Encounter: Payer: Self-pay | Admitting: Family Medicine

## 2023-09-30 VITALS — BP 112/74 | HR 60 | Ht 76.0 in | Wt 284.0 lb

## 2023-09-30 DIAGNOSIS — M549 Dorsalgia, unspecified: Secondary | ICD-10-CM | POA: Diagnosis not present

## 2023-09-30 DIAGNOSIS — S6981XA Other specified injuries of right wrist, hand and finger(s), initial encounter: Secondary | ICD-10-CM | POA: Diagnosis not present

## 2023-09-30 DIAGNOSIS — M5416 Radiculopathy, lumbar region: Secondary | ICD-10-CM | POA: Diagnosis not present

## 2023-09-30 DIAGNOSIS — M545 Low back pain, unspecified: Secondary | ICD-10-CM

## 2023-09-30 DIAGNOSIS — M25531 Pain in right wrist: Secondary | ICD-10-CM | POA: Diagnosis not present

## 2023-09-30 MED ORDER — GABAPENTIN 100 MG PO CAPS: 200.0000 mg | ORAL_CAPSULE | Freq: Every day | ORAL | 0 refills | Status: DC

## 2023-09-30 MED ORDER — PREDNISONE 50 MG PO TABS: ORAL_TABLET | ORAL | 0 refills | Status: DC

## 2023-09-30 NOTE — Assessment & Plan Note (Signed)
Subacute lumbar radiculopathy.  The patient does have a positive straight leg test noted.  Discussed icing regimen and home exercises.  Discussed which activities to do and which ones to avoid.  Prednisone and gabapentin given which I think will be beneficial.  X-rays are pending.  No worsening pain or weakness we will need to consider the possibility of advanced imaging.  Follow-up with me again in 4 weeks otherwise.

## 2023-09-30 NOTE — Patient Instructions (Addendum)
Prednisone 50mg  for 5 days Gabapentin 200mg  Wrist use Coban Avoid externsion of wrist Keep hands in neutral Voltaren Gel Xray lumbar spine See me in 6 weeks

## 2023-09-30 NOTE — Assessment & Plan Note (Signed)
Injury with some swelling.  Discussed some neutral positions, discussed which activities to do and which ones to avoid.  Worsening pain will consider injection and potentially custom bracing.  Follow-up again 6 to 8 weeks otherwise.

## 2023-11-10 NOTE — Progress Notes (Unsigned)
[facility] [address] [place] [address] Phone: [phone] Subjective:   Bruce Donath, am serving as a scribe for Dr. Antoine Primas.  I'm seeing this patient by the request  of:  Wanda Plump, MD  CC: Right sided wrist pain  [id number]  09/30/2023 Injury with some swelling.  Discussed some neutral positions, discussed which activities to do and which ones to avoid.  Worsening pain will consider injection and potentially custom bracing.  Follow-up again 6 to 8 weeks otherwise.     Subacute lumbar radiculopathy.  The patient does have a positive straight leg test noted.  Discussed icing regimen and home exercises.  Discussed which activities to do and which ones to avoid.  Prednisone and gabapentin given which I think will be beneficial.  X-rays are pending.  No worsening pain or weakness we will need to consider the possibility of advanced imaging.  Follow-up with me again in 4 weeks otherwise.   Updated 11/11/2023 Wayne Hart is a 42 y.o. male coming in with complaint of wrist and back pain, was seen previously for the wrist and was for TFCC injury. Patient wraps wrist but pain is still present.   Low back patient did have more of a positive radicular symptoms.  Started on gabapentin.  Given prednisone short course.  X-rays of lumbar spine were done last time that showed no significant bony abnormality.  These were independently visualized by me. Back pain comes and goes. Activity seems to reduce his pain. Does not feel like prednisone was helpful. Gabapentin was also not helpful so patient discontinued medication.      Past Medical History:  Diagnosis Date   Allergy    seasonal   Past Surgical History:  Procedure Laterality Date   KNEE ARTHROSCOPY WITH EXCISION PLICA Right 08/06/2018   Procedure: KNEE ARTHROSCOPY WITH EXCISION PLICA;  Surgeon: Tarry Kos, MD;  Location: [facility];  Service: Orthopedics;  Laterality:  Right;   KNEE ARTHROSCOPY WITH MEDIAL MENISECTOMY Right 08/06/2018   Procedure: RIGHT KNEE ARTHROSCOPY WITH PARTIAL MEDIAL MENISCECTOMY;  Surgeon: Tarry Kos, MD;  Location: [facility];  Service: Orthopedics;  Laterality: Right;  injection of tennis elbow right 1cc marcaine .25 1cc depomedrol 40mg  and lidocaine 1 % 1cc  at 1230   ORIF CLAVICLE FRACTURE Right 03/02/2009   VASECTOMY  10/2015   Social History   Socioeconomic History   Marital status: Married    Spouse name: Not on file   Number of children: 2   Years of education: Not on file   Highest education level: Bachelor's degree (e.g., BA, AB, BS)  Occupational History   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Banker  Tobacco Use   Smoking status: Some Days    Types: Cigars   Smokeless tobacco: Never   Tobacco comments:    occ cigar  Vaping Use   Vaping status: Never Used  Substance and Sexual Activity   Alcohol use: Yes    Comment: occasionally   Drug use: Never   Sexual activity: Not on file  Other Topics Concern   Not on file  Social History Narrative   2016 boy    2014 girl    Social Determinants of Health   Financial Resource Strain: Low Risk  (05/20/2023)   Overall Financial Resource Strain (CARDIA)    Difficulty of Paying Living Expenses: Not hard at all  Food Insecurity: No Food Insecurity (05/20/2023)   Hunger Vital Sign  Worried About Programme researcher, broadcasting/film/video in the Last Year: Never true    Ran Out of Food in the Last Year: Never true  Transportation Needs: No Transportation Needs (05/20/2023)   PRAPARE - Administrator, Civil Service (Medical): No    Lack of Transportation (Non-Medical): No  Physical Activity: Sufficiently Active (05/20/2023)   Exercise Vital Sign    Days of Exercise per Week: 3 days    Minutes of Exercise per Session: 80 min  Stress: No Stress Concern Present (05/20/2023)   [employer] of Occupational Health - Occupational Stress Questionnaire    Feeling of Stress  : Not at all  Social Connections: Socially Integrated (05/20/2023)   Social Connection and Isolation Panel [NHANES]    Frequency of Communication with Friends and Family: More than three times a week    Frequency of Social Gatherings with Friends and Family: More than three times a week    Attends Religious Services: More than 4 times per year    Active Member of [employer] or Organizations: Yes    Attends Engineer, structural: More than 4 times per year    Marital Status: Married   No Known Allergies Family History  Problem Relation Age of Onset   Myelodysplastic syndrome Father 75   Diabetes Maternal Grandmother    Prostate cancer Neg Hx    Colon cancer Neg Hx    CAD Neg Hx     Current Outpatient Medications (Endocrine & Metabolic):    predniSONE (DELTASONE) 50 MG tablet, Take one tablet daily for the next 5 days.      Current Outpatient Medications (Other):    gabapentin (NEURONTIN) 100 MG capsule, Take 2 capsules (200 mg total) by mouth at bedtime.   Reviewed prior external information including notes and imaging from  primary care provider As well as notes that were available from care everywhere and other healthcare systems.  Past medical history, social, surgical and family history all reviewed in electronic medical record.  No pertanent information unless stated regarding to the chief complaint.   Review of Systems:  No headache, visual changes, nausea, vomiting, diarrhea, constipation, dizziness, abdominal pain, skin rash, fevers, chills, night sweats, weight loss, swollen lymph nodes, body aches, joint swelling, chest pain, shortness of breath, mood changes. POSITIVE muscle aches  Objective  Blood pressure 122/82, pulse 74, height 6\' 4"  (1.93 m), weight 289 lb (131.1 kg), SpO2 96%.   General: No apparent distress alert and oriented x3 mood and affect normal, dressed appropriately.  HEENT: Pupils equal, extraocular movements intact  Respiratory: Patient's  speak in full sentences and does not appear short of breath  Cardiovascular: No lower extremity edema, non tender, no erythema  Wrist exam shows patient still has tenderness to palpation of the TFCC.  Patient does have positive pain with Claudette Laws test.  Low back exam still tightness noted with straight leg test on the left side. Patient does have tightness noted with FABER test right greater than left  Osteopathic findings  T9 extended rotated and side bent left L2 flexed rotated and side bent right L4 flexed rotated and side bent right Sacrum right on right      Impression and Recommendations:     The above documentation has been reviewed and is accurate and complete Judi Saa, DO

## 2023-11-11 ENCOUNTER — Ambulatory Visit: Payer: 59 | Admitting: Family Medicine

## 2023-11-11 ENCOUNTER — Encounter: Payer: Self-pay | Admitting: Family Medicine

## 2023-11-11 ENCOUNTER — Other Ambulatory Visit: Payer: Self-pay

## 2023-11-11 VITALS — BP 122/82 | HR 74 | Ht 76.0 in | Wt 289.0 lb

## 2023-11-11 DIAGNOSIS — M9902 Segmental and somatic dysfunction of thoracic region: Secondary | ICD-10-CM | POA: Diagnosis not present

## 2023-11-11 DIAGNOSIS — M25531 Pain in right wrist: Secondary | ICD-10-CM | POA: Diagnosis not present

## 2023-11-11 DIAGNOSIS — M9904 Segmental and somatic dysfunction of sacral region: Secondary | ICD-10-CM | POA: Diagnosis not present

## 2023-11-11 DIAGNOSIS — M9903 Segmental and somatic dysfunction of lumbar region: Secondary | ICD-10-CM | POA: Diagnosis not present

## 2023-11-11 DIAGNOSIS — M5416 Radiculopathy, lumbar region: Secondary | ICD-10-CM

## 2023-11-11 NOTE — Assessment & Plan Note (Addendum)
Patient is having less radicular symptoms.  Attempted osteopathic manipulation with some fairly good resolution of some pain.  Patient hopefully will feel significantly better.  Discussed icing regimen and home exercises otherwise.  Discussed which activities to do and which ones to avoid.  Increase activity slowly over the course of next several weeks.  Follow-up again in 6 to 8 weeks.  If any increasing in weakness or radicular symptoms we may need to consider possible advanced imaging

## 2023-11-11 NOTE — Patient Instructions (Signed)
Good to see you! Tried manipulations today Keep doing exercises See you again in 7-8 weeks

## 2024-01-04 NOTE — Progress Notes (Signed)
 [facility] [address] [place] [address] Phone: [phone] Subjective:   Wayne Hart, am serving as a scribe for Dr. Ronnell Coins.  I'm seeing this patient by the request  of:  Ezell Hollow, MD  CC: Back and neck pain follow-up  GEX:[id number]  Wayne Hart is a 43 y.o. male coming in with complaint of back and neck pain. OMT 11/11/2023. Also f/u for R wrist pain. Patient states that his back is doing well. Patient continues to have pain with loaded wrist extension. Pain over posterior aspect.   Medications patient has been prescribed: Gabapentin   Taking: Yes         Reviewed prior external information including notes and imaging from previsou exam, outside providers and external EMR if available.   As well as notes that were available from care everywhere and other healthcare systems.  Past medical history, social, surgical and family history all reviewed in electronic medical record.  No pertanent information unless stated regarding to the chief complaint.   Past Medical History:  Diagnosis Date   Allergy    seasonal    No Known Allergies   Review of Systems:  No headache, visual changes, nausea, vomiting, diarrhea, constipation, dizziness, abdominal pain, skin rash, fevers, chills, night sweats, weight loss, swollen lymph nodes, body aches, joint swelling, chest pain, shortness of breath, mood changes. POSITIVE muscle aches  Objective  Blood pressure 110/72, pulse 66, height 6\' 4"  (1.93 m), weight 299 lb (135.6 kg), SpO2 98%.   General: No apparent distress alert and oriented x3 mood and affect normal, dressed appropriately.  HEENT: Pupils equal, extraocular movements intact  Respiratory: Patient's speak in full sentences and does not appear short of breath  Cardiovascular: No lower extremity edema, non tender, no erythema  Gait MSK:  Back does have some loss lordosis noted.  Some tenderness to palpation in the  paraspinal musculature.  Tightness with FABER.  Osteopathic findings  C2 flexed rotated and side bent left C4 flexed rotated and side bent left T9 extended rotated and side bent left inhaled rib L2 flexed rotated and side bent right Sacrum right on right    Limited muscular skeletal ultrasound was performed and interpreted by Ronnell Coins, M   Limited ultrasound of patient's wrist shows some hypoechoic changes over the TFCC.  Mild displacement of the TFCC also noted.  Difficult to assess the scaphoid lunate ligament. Impression: Continued hypoechoic changes consistent with swelling of the TFCC   Assessment and Plan:  TFCC (triangular fibrocartilage complex) injury, right, initial encounter Continues to have some hypoechoic changes and swelling noted in the area.  We discussed different treatment options including the possibility of an injection.  At this point patient would like to just continue with taping and avoid certain movements.  We discussed topical anti-inflammatories and icing regimen may help with some of the symptoms.  Patient will follow-up with me again in 2 to 3 months.  Lumbar radiculopathy Low back is doing significantly better.  Did seem to have some tightness noted in the paraspinal musculature of the neck.  Discussed with patient about icing regimen and home exercises, discussed avoiding certain lifting mechanics but continuing to work on core strengthening.  Patient seems to be dedicated to making changes this year.  Follow-up with me again in 12 weeks    Nonallopathic problems  Decision today to treat with OMT was based on Physical Exam  After verbal consent patient was treated with HVLA,  ME, FPR techniques in cervical, rib, thoracic, lumbar, and sacral  areas  Patient tolerated the procedure well with improvement in symptoms  Patient given exercises, stretches and lifestyle modifications  See medications in patient instructions if given  Patient will  follow up in 4-8 weeks    The above documentation has been reviewed and is accurate and complete Wayne Margo, DO          Note: This dictation was prepared with Dragon dictation along with smaller phrase technology. Any transcriptional errors that result from this process are unintentional.

## 2024-01-06 ENCOUNTER — Encounter: Payer: Self-pay | Admitting: Family Medicine

## 2024-01-06 ENCOUNTER — Other Ambulatory Visit: Payer: Self-pay

## 2024-01-06 ENCOUNTER — Ambulatory Visit: Payer: 59 | Admitting: Family Medicine

## 2024-01-06 VITALS — BP 110/72 | HR 66 | Ht 76.0 in | Wt 299.0 lb

## 2024-01-06 DIAGNOSIS — M9903 Segmental and somatic dysfunction of lumbar region: Secondary | ICD-10-CM | POA: Diagnosis not present

## 2024-01-06 DIAGNOSIS — M9902 Segmental and somatic dysfunction of thoracic region: Secondary | ICD-10-CM | POA: Diagnosis not present

## 2024-01-06 DIAGNOSIS — S6981XA Other specified injuries of right wrist, hand and finger(s), initial encounter: Secondary | ICD-10-CM | POA: Diagnosis not present

## 2024-01-06 DIAGNOSIS — M25531 Pain in right wrist: Secondary | ICD-10-CM | POA: Diagnosis not present

## 2024-01-06 DIAGNOSIS — M5416 Radiculopathy, lumbar region: Secondary | ICD-10-CM

## 2024-01-06 DIAGNOSIS — M9904 Segmental and somatic dysfunction of sacral region: Secondary | ICD-10-CM | POA: Diagnosis not present

## 2024-01-06 DIAGNOSIS — M9901 Segmental and somatic dysfunction of cervical region: Secondary | ICD-10-CM | POA: Diagnosis not present

## 2024-01-06 DIAGNOSIS — M9908 Segmental and somatic dysfunction of rib cage: Secondary | ICD-10-CM | POA: Diagnosis not present

## 2024-01-06 NOTE — Assessment & Plan Note (Signed)
 Low back is doing significantly better.  Did seem to have some tightness noted in the paraspinal musculature of the neck.  Discussed with patient about icing regimen and home exercises, discussed avoiding certain lifting mechanics but continuing to work on core strengthening.  Patient seems to be dedicated to making changes this year.  Follow-up with me again in 12 weeks

## 2024-01-06 NOTE — Patient Instructions (Signed)
 Good to see you  Continue to watch the wrist  Thicker grip on racquet and golf clubs  Follow up in 3 months

## 2024-01-06 NOTE — Assessment & Plan Note (Signed)
 Continues to have some hypoechoic changes and swelling noted in the area.  We discussed different treatment options including the possibility of an injection.  At this point patient would like to just continue with taping and avoid certain movements.  We discussed topical anti-inflammatories and icing regimen may help with some of the symptoms.  Patient will follow-up with me again in 2 to 3 months.

## 2024-03-31 ENCOUNTER — Encounter: Payer: Self-pay | Admitting: Family Medicine

## 2024-03-31 ENCOUNTER — Ambulatory Visit (INDEPENDENT_AMBULATORY_CARE_PROVIDER_SITE_OTHER): Admitting: Family Medicine

## 2024-03-31 ENCOUNTER — Other Ambulatory Visit: Payer: Self-pay

## 2024-03-31 VITALS — BP 110/72 | HR 51 | Ht 76.0 in | Wt 303.0 lb

## 2024-03-31 DIAGNOSIS — M9903 Segmental and somatic dysfunction of lumbar region: Secondary | ICD-10-CM

## 2024-03-31 DIAGNOSIS — M9902 Segmental and somatic dysfunction of thoracic region: Secondary | ICD-10-CM

## 2024-03-31 DIAGNOSIS — S6981XA Other specified injuries of right wrist, hand and finger(s), initial encounter: Secondary | ICD-10-CM

## 2024-03-31 DIAGNOSIS — M9901 Segmental and somatic dysfunction of cervical region: Secondary | ICD-10-CM

## 2024-03-31 DIAGNOSIS — M9904 Segmental and somatic dysfunction of sacral region: Secondary | ICD-10-CM | POA: Diagnosis not present

## 2024-03-31 DIAGNOSIS — M25531 Pain in right wrist: Secondary | ICD-10-CM

## 2024-03-31 DIAGNOSIS — M9908 Segmental and somatic dysfunction of rib cage: Secondary | ICD-10-CM | POA: Diagnosis not present

## 2024-03-31 DIAGNOSIS — M5416 Radiculopathy, lumbar region: Secondary | ICD-10-CM | POA: Diagnosis not present

## 2024-03-31 NOTE — Assessment & Plan Note (Signed)
 Patient given injection and tolerated the procedure well, discussed icing regimen of home exercises, discussed which activities to do and which ones to avoid.  Increase activity slowly otherwise.  If not making significant improvement I do feel MRI is necessary.

## 2024-03-31 NOTE — Patient Instructions (Addendum)
 Good to see you. Injected wrist today. Enjoy your trip. Good luck at the tournament. Return in 2 to 3 months.

## 2024-03-31 NOTE — Assessment & Plan Note (Signed)
 Likely no radicular symptoms noted.  Discussed icing regimen and home exercises, discussed which activities to do and which ones to avoid.  Increase activity slowly.  Follow-up again in 6 to 8 weeks otherwise.

## 2024-03-31 NOTE — Progress Notes (Signed)
 [facility] [address] [place] [address] Phone: [phone] Subjective:   Bruce Donath, am serving as a scribe for Dr. Antoine Primas.  I'm seeing this patient by the request  of:  Wanda Plump, MD  CC: Back pain and wrist pain  BJY:[id number]  Wayne Hart is a 43 y.o. male coming in with complaint of back and neck pain.  Patient also been seen for his right wrist.  Was seen 3 months ago and continued to have some swelling noted of the TFCC area.  Patient states Wrist pain has gotten worse. Pain on dorsal aspect of wrist. Wearing brace to sleep as wrist get sore at night. Painful when loaded.   Back is doing better. Adjustments help but he feels like it is time.   Medications patient has been prescribed:   Taking:         Reviewed prior external information including notes and imaging from previsou exam, outside providers and external EMR if available.   As well as notes that were available from care everywhere and other healthcare systems.  Past medical history, social, surgical and family history all reviewed in electronic medical record.  No pertanent information unless stated regarding to the chief complaint.   Past Medical History:  Diagnosis Date   Allergy    seasonal    No Known Allergies   Review of Systems:  No headache, visual changes, nausea, vomiting, diarrhea, constipation, dizziness, abdominal pain, skin rash, fevers, chills, night sweats, weight loss, swollen lymph nodes, body aches, joint swelling, chest pain, shortness of breath, mood changes. POSITIVE muscle aches  Objective  Blood pressure 110/72, pulse (!) 51, height 6\' 4"  (1.93 m), weight (!) 303 lb (137.4 kg), SpO2 98%.   General: No apparent distress alert and oriented x3 mood and affect normal, dressed appropriately.  HEENT: Pupils equal, extraocular movements intact  Respiratory: Patient's speak in full sentences and does not appear short of breath   Cardiovascular: No lower extremity edema, non tender, no erythema  Gait relatively normal  MSK:  Back does have some loss lordosis.  Some tightness noted in the paraspinal musculature. Right wrist exam shows shows tightness noted.  Does have some limited extension of the wrist noted.  Osteopathic findings  C2 flexed rotated and side bent right C5 flexed rotated and side bent left T5 extended rotated and side bent right inhaled rib T6 extended rotated and side bent left L3 flexed rotated and side bent right Sacrum right on right   Procedure: Real-time Ultrasound Guided Injection of right TFCC Device: GE Logiq Q7 Ultrasound guided injection is preferred based studies that show increased duration, increased effect, greater accuracy, decreased procedural pain, increased response rate, and decreased cost with ultrasound guided versus blind injection.  Verbal informed consent obtained.  Time-out conducted.  Noted no overlying erythema, induration, or other signs of local infection.  Skin prepped in a sterile fashion.  Local anesthesia: Topical Ethyl chloride.  With sterile technique and under real time ultrasound guidance: With a 25-gauge half inch needle injected with 0.5 cc of 0.5% Marcaine and 0.5 cc of Kenalog 40 mg/mL Completed without difficulty  Pain immediately resolved suggesting accurate placement of the medication.  Advised to call if fevers/chills, erythema, induration, drainage, or persistent bleeding.  Impression: Technically successful ultrasound guided injection.    Assessment and Plan:  TFCC (triangular fibrocartilage complex) injury, right, initial encounter Patient given injection and tolerated the procedure well, discussed icing regimen of  home exercises, discussed which activities to do and which ones to avoid.  Increase activity slowly otherwise.  If not making significant improvement I do feel MRI is necessary.    Nonallopathic problems  Decision today to  treat with OMT was based on Physical Exam  After verbal consent patient was treated with HVLA, ME, FPR techniques in cervical, rib, thoracic, lumbar, and sacral  areas  Patient tolerated the procedure well with improvement in symptoms  Patient given exercises, stretches and lifestyle modifications  See medications in patient instructions if given  Patient will follow up in 4-8 weeks     The above documentation has been reviewed and is accurate and complete Judi Saa, DO         Note: This dictation was prepared with Dragon dictation along with smaller phrase technology. Any transcriptional errors that result from this process are unintentional.

## 2024-04-13 ENCOUNTER — Ambulatory Visit: Payer: 59 | Admitting: Family Medicine

## 2024-05-11 ENCOUNTER — Encounter: Payer: 59 | Admitting: Internal Medicine

## 2024-05-31 ENCOUNTER — Encounter: Payer: Self-pay | Admitting: Family Medicine

## 2024-05-31 NOTE — Progress Notes (Unsigned)
 [facility] [address] [place] [address] Phone: [phone] Subjective:   IBryan Hart, am serving as a scribe for Dr. Ronnell Coins.  I'm seeing this patient by the request  of:  Ezell Hollow, MD  CC: Back pain, neck pain and wrist pain follow-up  BJY:[id number]  [name] is a 43 y.o. male coming in with complaint of back and neck pain. OMT on 03/31/2024.  Patient was also found to have a TFCC injury and given an injection on April 10 that did not make any significant improvement.  Patient states back is getting a lot better.  Medications patient has been prescribed:   Taking:  Patient did have x-rays before we saw him today.  Does seem that patient does have an area of cortical irregularity and a couple potential sesamoid bones.  Versus from a previous fracture.     Reviewed prior external information including notes and imaging from previsou exam, outside providers and external EMR if available.   As well as notes that were available from care everywhere and other healthcare systems.  Past medical history, social, surgical and family history all reviewed in electronic medical record.  No pertanent information unless stated regarding to the chief complaint.   Past Medical History:  Diagnosis Date   Allergy    seasonal    No Known Allergies   Review of Systems:  No headache, visual changes, nausea, vomiting, diarrhea, constipation, dizziness, abdominal pain, skin rash, fevers, chills, night sweats, weight loss, swollen lymph nodes, body aches, joint swelling, chest pain, shortness of breath, mood changes. POSITIVE muscle aches  Objective  Pulse (!) 52, height 6' 4 (1.93 m), SpO2 97%.   General: No apparent distress alert and oriented x3 mood and affect normal, dressed appropriately.  HEENT: Pupils equal, extraocular movements intact  Respiratory: Patient's speak in full sentences and does not appear short of breath   Cardiovascular: No lower extremity edema, non tender, no erythema  Gait MSK:  Back  Wrist exam shows patient does have some limited range of motion noted on the right side.  Still tender to palpation noted.  Osteopathic findings  C2 flexed rotated and side bent right C6 flexed rotated and side bent left T3 extended rotated and side bent right inhaled rib T9 extended rotated and side bent left L2 flexed rotated and side bent right L3 flexed rotated and side bent left Sacrum right on right  Limited muscular skeletal ultrasound was performed and interpreted by Ronnell Coins, M  Limited ultrasound still shows that patient has hypoechoic changes of the scaphoid lunate joint noted.  Difficult to assess the scaphoid lunate ligament.  TFCC does have some hypoechoic changes noted as well.   Assessment and Plan:  TFCC (triangular fibrocartilage complex) injury, right, initial encounter Patient did not respond well to the injection and effusion is already back at this time of the wrist.  Having signs and symptoms of some instability of the wrist.  Affecting daily activities.  Discussed with patient that I would like to consider the possibility of advanced imaging with him failing all conservative therapy.  Will get an MR arthrogram to further evaluate.  Lumbar radiculopathy Likely no significant radicular symptoms at this time but still having some back pain. Responding extremely well though to osteopathic manipulation after evaluation today.  Discussed icing regimen and home exercises, increasing activity slowly.  Discussed which activities to do and which ones to avoid.  Follow-up with me again  in 6 to 8 weeks otherwise.    Nonallopathic problems  Decision today to treat with OMT was based on Physical Exam  After verbal consent patient was treated with HVLA, ME, FPR techniques in cervical, rib, thoracic, lumbar, and sacral  areas avoided HVLA on the cervical spine and muscle  energy  Patient tolerated the procedure well with improvement in symptoms  Patient given exercises, stretches and lifestyle modifications  See medications in patient instructions if given  Patient will follow up in 4-8 weeks     The above documentation has been reviewed and is accurate and complete Aarna Mihalko M Amirra Herling, DO         Note: This dictation was prepared with Dragon dictation along with smaller phrase technology. Any transcriptional errors that result from this process are unintentional.

## 2024-06-01 ENCOUNTER — Encounter: Payer: Self-pay | Admitting: Family Medicine

## 2024-06-01 ENCOUNTER — Ambulatory Visit (INDEPENDENT_AMBULATORY_CARE_PROVIDER_SITE_OTHER)

## 2024-06-01 ENCOUNTER — Other Ambulatory Visit: Payer: Self-pay

## 2024-06-01 ENCOUNTER — Ambulatory Visit: Admitting: Family Medicine

## 2024-06-01 VITALS — HR 52 | Ht 76.0 in

## 2024-06-01 DIAGNOSIS — M5416 Radiculopathy, lumbar region: Secondary | ICD-10-CM | POA: Diagnosis not present

## 2024-06-01 DIAGNOSIS — M9908 Segmental and somatic dysfunction of rib cage: Secondary | ICD-10-CM

## 2024-06-01 DIAGNOSIS — G8929 Other chronic pain: Secondary | ICD-10-CM | POA: Diagnosis not present

## 2024-06-01 DIAGNOSIS — M9902 Segmental and somatic dysfunction of thoracic region: Secondary | ICD-10-CM

## 2024-06-01 DIAGNOSIS — M9903 Segmental and somatic dysfunction of lumbar region: Secondary | ICD-10-CM

## 2024-06-01 DIAGNOSIS — M9904 Segmental and somatic dysfunction of sacral region: Secondary | ICD-10-CM

## 2024-06-01 DIAGNOSIS — M25531 Pain in right wrist: Secondary | ICD-10-CM

## 2024-06-01 DIAGNOSIS — S6981XA Other specified injuries of right wrist, hand and finger(s), initial encounter: Secondary | ICD-10-CM

## 2024-06-01 DIAGNOSIS — M1811 Unilateral primary osteoarthritis of first carpometacarpal joint, right hand: Secondary | ICD-10-CM | POA: Diagnosis not present

## 2024-06-01 DIAGNOSIS — M9901 Segmental and somatic dysfunction of cervical region: Secondary | ICD-10-CM

## 2024-06-01 NOTE — Patient Instructions (Signed)
  Imaging [phone] Call Today  When we receive your results we will contact you. Kick ass at tournament See you again in 10 weeks for manipulation

## 2024-06-01 NOTE — Assessment & Plan Note (Signed)
 Patient did not respond well to the injection and effusion is already back at this time of the wrist.  Having signs and symptoms of some instability of the wrist.  Affecting daily activities.  Discussed with patient that I would like to consider the possibility of advanced imaging with him failing all conservative therapy.  Will get an MR arthrogram to further evaluate.

## 2024-06-01 NOTE — Assessment & Plan Note (Signed)
 Likely no significant radicular symptoms at this time but still having some back pain. Responding extremely well though to osteopathic manipulation after evaluation today.  Discussed icing regimen and home exercises, increasing activity slowly.  Discussed which activities to do and which ones to avoid.  Follow-up with me again in 6 to 8 weeks otherwise.

## 2024-06-02 ENCOUNTER — Encounter: Payer: Self-pay | Admitting: Family Medicine

## 2024-06-03 ENCOUNTER — Encounter: Payer: Self-pay | Admitting: Internal Medicine

## 2024-06-05 ENCOUNTER — Ambulatory Visit: Payer: Self-pay | Admitting: Family Medicine

## 2024-06-06 ENCOUNTER — Encounter: Payer: Self-pay | Admitting: Family Medicine

## 2024-06-15 ENCOUNTER — Other Ambulatory Visit

## 2024-07-04 ENCOUNTER — Encounter: Payer: Self-pay | Admitting: Family Medicine

## 2024-07-04 ENCOUNTER — Other Ambulatory Visit (HOSPITAL_BASED_OUTPATIENT_CLINIC_OR_DEPARTMENT_OTHER): Payer: Self-pay

## 2024-07-04 ENCOUNTER — Ambulatory Visit (HOSPITAL_BASED_OUTPATIENT_CLINIC_OR_DEPARTMENT_OTHER)

## 2024-07-04 ENCOUNTER — Ambulatory Visit (HOSPITAL_BASED_OUTPATIENT_CLINIC_OR_DEPARTMENT_OTHER): Admitting: Student

## 2024-07-04 ENCOUNTER — Encounter (HOSPITAL_BASED_OUTPATIENT_CLINIC_OR_DEPARTMENT_OTHER): Payer: Self-pay | Admitting: Student

## 2024-07-04 DIAGNOSIS — M5127 Other intervertebral disc displacement, lumbosacral region: Secondary | ICD-10-CM | POA: Diagnosis not present

## 2024-07-04 DIAGNOSIS — M545 Low back pain, unspecified: Secondary | ICD-10-CM

## 2024-07-04 DIAGNOSIS — M48061 Spinal stenosis, lumbar region without neurogenic claudication: Secondary | ICD-10-CM | POA: Diagnosis not present

## 2024-07-04 DIAGNOSIS — M5126 Other intervertebral disc displacement, lumbar region: Secondary | ICD-10-CM | POA: Diagnosis not present

## 2024-07-04 DIAGNOSIS — M5136 Other intervertebral disc degeneration, lumbar region with discogenic back pain only: Secondary | ICD-10-CM | POA: Diagnosis not present

## 2024-07-04 MED ORDER — METHYLPREDNISOLONE 4 MG PO TBPK
ORAL_TABLET | ORAL | 0 refills | Status: DC
  Filled 2024-07-04: qty 21, 6d supply, fill #0

## 2024-07-04 NOTE — Progress Notes (Signed)
 Chief Complaint: Low back pain    Discussed the use of AI scribe software for clinical note transcription with the patient, who gave verbal consent to proceed.  History of Present Illness Wayne Hart is a 43 year old male who presents with acute right-sided lower back pain.  He experiences acute right-sided lower back pain since Saturday, described as 'locked up' and severe in the mornings, hindering mobility. The pain is localized to the right lower back without radiation to the legs, and there is no numbness or tingling. He has attempted treatments including ice, heat, and a topical patch, which provide some midday relief. Robaxin and ibuprofen have not significantly alleviated the pain. The pain feels muscular, with soreness from tension to avoid triggering it.  He is physically active, playing racquetball and golf, and performs physical tasks at work. He does not recall a specific incident causing the pain. He has experienced lower back pain before but describes this episode as different and more severe. He has previously seen Sports Medicine for lower back pain, which was resolved with manipulation and adjustments. This current episode feels different from past experiences.   Surgical History:   None  PMH/PSH/Family History/Social History/Meds/Allergies:    Past Medical History:  Diagnosis Date   Allergy    seasonal   Past Surgical History:  Procedure Laterality Date   KNEE ARTHROSCOPY WITH EXCISION PLICA Right 08/06/2018   Procedure: KNEE ARTHROSCOPY WITH EXCISION PLICA;  Surgeon: Jerri Kay HERO, MD;  Location: [facility];  Service: Orthopedics;  Laterality: Right;   KNEE ARTHROSCOPY WITH MEDIAL MENISECTOMY Right 08/06/2018   Procedure: RIGHT KNEE ARTHROSCOPY WITH PARTIAL MEDIAL MENISCECTOMY;  Surgeon: Jerri Kay HERO, MD;  Location: [facility];  Service: Orthopedics;  Laterality: Right;  injection of tennis elbow  right 1cc marcaine  .25 1cc depomedrol 40mg  and lidocaine  1 % 1cc  at 1230   ORIF CLAVICLE FRACTURE Right 03/02/2009   VASECTOMY  10/2015   Social History   Socioeconomic History   Marital status: Married    Spouse name: Not on file   Number of children: 2   Years of education: Not on file   Highest education level: Bachelor's degree (e.g., BA, AB, BS)  Occupational History   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Banker  Tobacco Use   Smoking status: Some Days    Types: Cigars   Smokeless tobacco: Never   Tobacco comments:    occ cigar  Vaping Use   Vaping status: Never Used  Substance and Sexual Activity   Alcohol use: Yes    Comment: occasionally   Drug use: Never   Sexual activity: Not on file  Other Topics Concern   Not on file  Social History Narrative   2016 boy    2014 girl    Social Drivers of Corporate investment banker Strain: Low Risk  (05/20/2023)   Overall Financial Resource Strain (CARDIA)    Difficulty of Paying Living Expenses: Not hard at all  Food Insecurity: No Food Insecurity (05/20/2023)   Hunger Vital Sign    Worried About Running Out of Food in the Last Year: Never true    Ran Out of Food in the Last Year: Never true  Transportation Needs: No Transportation Needs (05/20/2023)   PRAPARE - Transportation  Lack of Transportation (Medical): No    Lack of Transportation (Non-Medical): No  Physical Activity: Sufficiently Active (05/20/2023)   Exercise Vital Sign    Days of Exercise per Week: 3 days    Minutes of Exercise per Session: 80 min  Stress: No Stress Concern Present (05/20/2023)   [employer] of Occupational Health - Occupational Stress Questionnaire    Feeling of Stress : Not at all  Social Connections: Socially Integrated (05/20/2023)   Social Connection and Isolation Panel    Frequency of Communication with Friends and Family: More than three times a week    Frequency of Social Gatherings with Friends and Family: More than three times a  week    Attends Religious Services: More than 4 times per year    Active Member of [employer] or Organizations: Yes    Attends Engineer, structural: More than 4 times per year    Marital Status: Married   Family History  Problem Relation Age of Onset   Myelodysplastic syndrome Father 11   Diabetes Maternal Grandmother    Prostate cancer Neg Hx    Colon cancer Neg Hx    CAD Neg Hx    No Known Allergies Current Outpatient Medications  Medication Sig Dispense Refill   methylPREDNISolone  (MEDROL  DOSEPAK) 4 MG TBPK tablet Take per packet instructions 21 each 0   gabapentin  (NEURONTIN ) 100 MG capsule Take 2 capsules (200 mg total) by mouth at bedtime. 180 capsule 0   No current facility-administered medications for this visit.   No results found.  Review of Systems:   A ROS was performed including pertinent positives and negatives as documented in the HPI.  Physical Exam :   Constitutional: NAD and appears stated age Neurological: Alert and oriented Psych: Appropriate affect and cooperative There were no vitals taken for this visit.   Comprehensive Musculoskeletal Exam:    Tenderness over the right lumbar paraspinal musculature without any significant midline tenderness.  He demonstrates significant difficulty and rigidity with sit to stand.  No lower extremity weakness noted bilaterally.  Distal neurosensory exam intact.  Imaging:   Xray (lumbar spine 4 views): Mild lower facet arthropathy but otherwise negative for acute abnormality and well-maintained to spacing with normal lordotic alignment.  Small L2-L3 anterior osteophytes.   I personally reviewed and interpreted the radiographs.      Assessment & Plan Right-sided low back pain Patient is experiencing significant, atraumatic low back pain confined to the right paraspinal musculature.  Not experiencing any symptoms of radiculopathy.  X-rays show mild degenerative changes but overall are well-appearing.  No  significant relief thus far with Robaxin, ibuprofen, rest, topical patches, and ice/heat.  Will have him discontinue ibuprofen use for a Medrol  Dosepak provided today but can otherwise continue current therapies.  Will expect his symptoms to improve in the coming days but may consider addition of physical therapy if symptoms fail to resolve.     I personally saw and evaluated the patient, and participated in the management and treatment plan.  Leonce Reveal, PA-C Orthopedics

## 2024-08-10 ENCOUNTER — Encounter: Payer: Self-pay | Admitting: Internal Medicine

## 2024-08-10 ENCOUNTER — Ambulatory Visit (INDEPENDENT_AMBULATORY_CARE_PROVIDER_SITE_OTHER): Admitting: Internal Medicine

## 2024-08-10 VITALS — BP 128/70 | HR 64 | Temp 98.1°F | Resp 16 | Ht 76.0 in | Wt 300.4 lb

## 2024-08-10 DIAGNOSIS — Z0000 Encounter for general adult medical examination without abnormal findings: Secondary | ICD-10-CM

## 2024-08-10 NOTE — Patient Instructions (Signed)
 Vaccines I recommend: Flu shot this fall Consider a COVID booster   GO TO THE LAB :  Get the blood work   Your results will be posted on MyChart with my comments  Go to the front desk for the checkout Please make an appointment for a physical exam in 1 year

## 2024-08-10 NOTE — Progress Notes (Unsigned)
   Subjective:    Patient ID: Wayne Hart, male    DOB: 1981-10-19, 43 y.o.   MRN: [id number]  DOS:  08/10/2024 Type of visit - description: CPX Here for CPX. Had some MSK issues, back pain improved, still have some increased pain. Wt Readings from Last 3 Encounters:  08/10/24 (!) 300 lb 6 oz (136.2 kg)  03/31/24 (!) 303 lb (137.4 kg)  01/06/24 299 lb (135.6 kg)     Review of Systems See above   Past Medical History:  Diagnosis Date   Allergy    seasonal    Past Surgical History:  Procedure Laterality Date   KNEE ARTHROSCOPY WITH EXCISION PLICA Right 08/06/2018   Procedure: KNEE ARTHROSCOPY WITH EXCISION PLICA;  Surgeon: Jerri Kay HERO, MD;  Location: [facility];  Service: Orthopedics;  Laterality: Right;   KNEE ARTHROSCOPY WITH MEDIAL MENISECTOMY Right 08/06/2018   Procedure: RIGHT KNEE ARTHROSCOPY WITH PARTIAL MEDIAL MENISCECTOMY;  Surgeon: Jerri Kay HERO, MD;  Location: [facility];  Service: Orthopedics;  Laterality: Right;  injection of tennis elbow right 1cc marcaine  .25 1cc depomedrol 40mg  and lidocaine  1 % 1cc  at 1230   ORIF CLAVICLE FRACTURE Right 03/02/2009   VASECTOMY  10/2015    No current outpatient medications      Objective:   Physical Exam BP 128/70   Pulse 64   Temp 98.1 F (36.7 C) (Oral)   Resp 16   Ht 6' 4 (1.93 m)   Wt (!) 300 lb 6 oz (136.2 kg)   SpO2 97%   BMI 36.56 kg/m  General: Well developed, NAD, BMI noted Neck: No  thyromegaly  HEENT:  Normocephalic . Face symmetric, atraumatic Lungs:  CTA B Normal respiratory effort, no intercostal retractions, no accessory muscle use. Heart: RRR,  no murmur.  Abdomen:  Not distended, soft, non-tender. No rebound or rigidity.   Lower extremities: no pretibial edema bilaterally  Skin: Exposed areas without rash. Not pale. Not jaundice Neurologic:  alert & oriented X3.  Speech normal, gait appropriate for age and unassisted Strength symmetric and appropriate for  age.  Psych: Cognition and judgment appear intact.  Cooperative with normal attention span and concentration.  Behavior appropriate. No anxious or depressed appearing.     Assessment    ASSESSMENT Overweight  PLAN  Here for CPX -Td 2022 -Recommended flu shot every fall, COVID-vaccine is a good option -No FH of prostate or colon cancer -Labs: CMP FLP CBC -Lifestyle: See comments under overweight.  Overweight: We had a long conversation about the issue, he is able to lose weight temporarily but usually regaining weight months. Encouraged heart healthy diet and stay active. GLP-1's discussed as an option. RTC 1 year

## 2024-08-11 ENCOUNTER — Encounter: Payer: Self-pay | Admitting: Internal Medicine

## 2024-08-11 ENCOUNTER — Ambulatory Visit: Payer: Self-pay | Admitting: Internal Medicine

## 2024-08-11 LAB — LIPID PANEL
Cholesterol: 199 mg/dL (ref 0–200)
HDL: 56.2 mg/dL (ref >39.00)
LDL Cholesterol: 107 mg/dL — ABNORMAL HIGH (ref 0–99)
NonHDL: 142.94
Total CHOL/HDL Ratio: 4
Triglycerides: 179 mg/dL — ABNORMAL HIGH (ref 0.0–149.0)
VLDL: 35.8 mg/dL (ref 0.0–40.0)

## 2024-08-11 LAB — COMPREHENSIVE METABOLIC PANEL WITH GFR
ALT: 21 U/L (ref 0–53)
AST: 21 U/L (ref 0–37)
Albumin: 4.5 g/dL (ref 3.5–5.2)
Alkaline Phosphatase: 66 U/L (ref 39–117)
BUN: 17 mg/dL (ref 6–23)
CO2: 25 meq/L (ref 19–32)
Calcium: 9.1 mg/dL (ref 8.4–10.5)
Chloride: 102 meq/L (ref 96–112)
Creatinine, Ser: 1.12 mg/dL (ref 0.40–1.50)
GFR: 81.22 mL/min (ref >60.00)
Glucose, Bld: 81 mg/dL (ref 70–99)
Potassium: 3.7 meq/L (ref 3.5–5.1)
Sodium: 138 meq/L (ref 135–145)
Total Bilirubin: 0.8 mg/dL (ref 0.2–1.2)
Total Protein: 7.1 g/dL (ref 6.0–8.3)

## 2024-08-11 LAB — CBC WITH DIFFERENTIAL/PLATELET
Basophils Absolute: 0.1 K/uL (ref 0.0–0.1)
Basophils Relative: 1.5 % (ref 0.0–3.0)
Eosinophils Absolute: 0.2 K/uL (ref 0.0–0.7)
Eosinophils Relative: 2.2 % (ref 0.0–5.0)
HCT: 43.3 % (ref 39.0–52.0)
Hemoglobin: 15.3 g/dL (ref 13.0–17.0)
Lymphocytes Relative: 28.6 % (ref 12.0–46.0)
Lymphs Abs: 2.4 K/uL (ref 0.7–4.0)
MCHC: 35.2 g/dL (ref 30.0–36.0)
MCV: 89.5 fl (ref 78.0–100.0)
Monocytes Absolute: 0.6 K/uL (ref 0.1–1.0)
Monocytes Relative: 7.1 % (ref 3.0–12.0)
Neutro Abs: 5.1 K/uL (ref 1.4–7.7)
Neutrophils Relative %: 60.6 % (ref 43.0–77.0)
Platelets: 223 K/uL (ref 150.0–400.0)
RBC: 4.84 Mil/uL (ref 4.22–5.81)
RDW: 13 % (ref 11.5–15.5)
WBC: 8.4 K/uL (ref 4.0–10.5)

## 2024-08-11 NOTE — Assessment & Plan Note (Signed)
 Here for CPX -Td 2022 -Recommended flu shot every fall, COVID-vaccine is a good option -No FH of prostate or colon cancer -Labs: CMP FLP CBC -Lifestyle: See comments under overweight.

## 2024-08-11 NOTE — Assessment & Plan Note (Signed)
 Here for CPX   Also address the following Overweight: We had a long conversation about the issue, he is able to lose weight temporarily but usually regaining weight within months. Encouraged heart healthy diet and stay active. GLP-1's discussed as an option. RTC 1 year

## 2024-08-11 NOTE — Progress Notes (Deleted)
  [facility] [address] [place] [address] Phone: [phone] Subjective:    I'm seeing this patient by the request  of:  Amon Aloysius BRAVO, MD  CC:   YEP:Dlagzrupcz  Wayne Hart is a 43 y.o. male coming in with complaint of back and neck pain. OMT 06/01/2024 and R wrist f/u. Patient states   Medications patient has been prescribed: None  Taking:         Reviewed prior external information including notes and imaging from previsou exam, outside providers and external EMR if available.   As well as notes that were available from care everywhere and other healthcare systems.  Past medical history, social, surgical and family history all reviewed in electronic medical record.  No pertanent information unless stated regarding to the chief complaint.   Past Medical History:  Diagnosis Date   Allergy    seasonal    No Known Allergies   Review of Systems:  No headache, visual changes, nausea, vomiting, diarrhea, constipation, dizziness, abdominal pain, skin rash, fevers, chills, night sweats, weight loss, swollen lymph nodes, body aches, joint swelling, chest pain, shortness of breath, mood changes. POSITIVE muscle aches  Objective  There were no vitals taken for this visit.   General: No apparent distress alert and oriented x3 mood and affect normal, dressed appropriately.  HEENT: Pupils equal, extraocular movements intact  Respiratory: Patient's speak in full sentences and does not appear short of breath  Cardiovascular: No lower extremity edema, non tender, no erythema  Gait MSK:  Back   Osteopathic findings  C2 flexed rotated and side bent right C6 flexed rotated and side bent left T3 extended rotated and side bent right inhaled rib T9 extended rotated and side bent left L2 flexed rotated and side bent right Sacrum right on right       Assessment and Plan:  No problem-specific Assessment & Plan notes found for this  encounter.    Nonallopathic problems  Decision today to treat with OMT was based on Physical Exam  After verbal consent patient was treated with HVLA, ME, FPR techniques in cervical, rib, thoracic, lumbar, and sacral  areas  Patient tolerated the procedure well with improvement in symptoms  Patient given exercises, stretches and lifestyle modifications  See medications in patient instructions if given  Patient will follow up in 4-8 weeks             Note: This dictation was prepared with Dragon dictation along with smaller phrase technology. Any transcriptional errors that result from this process are unintentional.

## 2024-08-15 ENCOUNTER — Ambulatory Visit: Admitting: Family Medicine

## 2024-08-19 ENCOUNTER — Encounter: Payer: Self-pay | Admitting: Family Medicine

## 2024-08-29 ENCOUNTER — Other Ambulatory Visit (HOSPITAL_BASED_OUTPATIENT_CLINIC_OR_DEPARTMENT_OTHER): Payer: Self-pay

## 2024-09-01 ENCOUNTER — Other Ambulatory Visit

## 2024-09-05 ENCOUNTER — Ambulatory Visit
Admission: RE | Admit: 2024-09-05 | Discharge: 2024-09-05 | Disposition: A | Discharge status: Home / Self Care | Source: Ambulatory Visit | Attending: Family Medicine | Admitting: Family Medicine

## 2024-09-05 DIAGNOSIS — M25531 Pain in right wrist: Secondary | ICD-10-CM

## 2024-09-05 MED ORDER — IOPAMIDOL (ISOVUE-M 200) INJECTION 41%
4.0000 mL | Freq: Once | INTRAMUSCULAR | Status: AC
  Administered 2024-09-05: 4 mL via INTRA_ARTICULAR

## 2024-09-06 ENCOUNTER — Ambulatory Visit: Payer: Self-pay | Admitting: Family Medicine

## 2024-09-13 NOTE — Progress Notes (Unsigned)
 [facility] [address] [place] [address] Phone: [phone] Subjective:   Wayne Hart, am serving as a scribe for Dr. Arthea Claudene.  I'm seeing this patient by the request  of:  Amon Aloysius BRAVO, MD  CC:  Wrist pain YEP:Dlagzrupcz  06/01/2024 Patient did not respond well to the injection and effusion is already back at this time of the wrist.  Having signs and symptoms of some instability of the wrist.  Affecting daily activities.  Discussed with patient that I would like to consider the possibility of advanced imaging with him failing all conservative therapy.  Will get an MR arthrogram to further evaluate.     Likely no significant radicular symptoms at this time but still having some back pain. Responding extremely well though to osteopathic manipulation after evaluation today.  Discussed icing regimen and home exercises, increasing activity slowly.  Discussed which activities to do and which ones to avoid.  Follow-up with me again in 6 to 8 weeks otherwise.      Update 09/14/2024 Wayne Hart is a 43 y.o. male coming in with complaint of R wrist pain. TFCC tear.  Patient noted and had an MRI that showed no significant tear.  Contrast was not quite where necessary.  This was independently visualized by me.  Patient states continues to have some discomfort.  Nothing limiting stopping him from activity but is unaware of it all the time.  He severe enough sometimes if he puts too much weight on it he will actually audibly see out.  Patient denies any swelling, any numbness, does change certain things       Past Medical History:  Diagnosis Date   Allergy    seasonal   Past Surgical History:  Procedure Laterality Date   KNEE ARTHROSCOPY WITH EXCISION PLICA Right 08/06/2018   Procedure: KNEE ARTHROSCOPY WITH EXCISION PLICA;  Surgeon: Jerri Kay HERO, MD;  Location: [facility];  Service: Orthopedics;  Laterality: Right;   KNEE  ARTHROSCOPY WITH MEDIAL MENISECTOMY Right 08/06/2018   Procedure: RIGHT KNEE ARTHROSCOPY WITH PARTIAL MEDIAL MENISCECTOMY;  Surgeon: Jerri Kay HERO, MD;  Location: [facility];  Service: Orthopedics;  Laterality: Right;  injection of tennis elbow right 1cc marcaine  .25 1cc depomedrol 40mg  and lidocaine  1 % 1cc  at 1230   ORIF CLAVICLE FRACTURE Right 03/02/2009   VASECTOMY  10/2015   Social History   Socioeconomic History   Marital status: Married    Spouse name: Not on file   Number of children: 2   Years of education: Not on file   Highest education level: Bachelor's degree (e.g., BA, AB, BS)  Occupational History   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Banker  Tobacco Use   Smoking status: Some Days    Types: Cigars   Smokeless tobacco: Never   Tobacco comments:    occ cigar  Vaping Use   Vaping status: Never Used  Substance and Sexual Activity   Alcohol use: Yes    Comment: occasionally   Drug use: Never   Sexual activity: Not on file  Other Topics Concern   Not on file  Social History Narrative   2016 boy    2014 girl    Social Drivers of Corporate investment banker Strain: Low Risk  (08/09/2024)   Overall Financial Resource Strain (CARDIA)    Difficulty of Paying Living Expenses: Not hard at all  Food Insecurity: No Food Insecurity (08/09/2024)  Hunger Vital Sign    Worried About Running Out of Food in the Last Year: Never true    Ran Out of Food in the Last Year: Never true  Transportation Needs: No Transportation Needs (08/09/2024)   PRAPARE - Administrator, Civil Service (Medical): No    Lack of Transportation (Non-Medical): No  Physical Activity: Sufficiently Active (08/09/2024)   Exercise Vital Sign    Days of Exercise per Week: 5 days    Minutes of Exercise per Session: 60 min  Stress: No Stress Concern Present (08/09/2024)   [employer] of Occupational Health - Occupational Stress Questionnaire    Feeling of Stress: Only a little   Social Connections: Socially Integrated (08/09/2024)   Social Connection and Isolation Panel    Frequency of Communication with Friends and Family: More than three times a week    Frequency of Social Gatherings with Friends and Family: Twice a week    Attends Religious Services: More than 4 times per year    Active Member of [employer] or Organizations: Yes    Attends Banker Meetings: 1 to 4 times per year    Marital Status: Married   No Known Allergies Family History  Problem Relation Age of Onset   Myelodysplastic syndrome Father 68   Diabetes Maternal Grandmother    Prostate cancer Neg Hx    Colon cancer Neg Hx    CAD Neg Hx    No current outpatient medications on file.   Reviewed prior external information including notes and imaging from  primary care provider As well as notes that were available from care everywhere and other healthcare systems.  Past medical history, social, surgical and family history all reviewed in electronic medical record.  No pertanent information unless stated regarding to the chief complaint.   Review of Systems:  No headache, visual changes, nausea, vomiting, diarrhea, constipation, dizziness, abdominal pain, skin rash, fevers, chills, night sweats, weight loss, swollen lymph nodes, body aches, joint swelling, chest pain, shortness of breath, mood changes. POSITIVE muscle aches  Objective  Blood pressure 116/72, pulse 91, height 6' 4 (1.93 m), SpO2 97%.   General: No apparent distress alert and oriented x3 mood and affect normal, dressed appropriately.  HEENT: Pupils equal, extraocular movements intact  Respiratory: Patient's speak in full sentences and does not appear short of breath  Cardiovascular: No lower extremity edema, non tender, no erythema  Wrist exam shows patient does have some pain with dorsiflexion especially against resistance.  Still some mild fullness noted over the TFC dorsally.  Limited muscular skeletal ultrasound  was performed and interpreted by CLAUDENE HUSSAR, M  Limited ultrasound still shows some hypoechoic changes and likely cyst formation noted. Impression: Questionable cyst in the TFCC  Procedure: Real-time Ultrasound Guided Injection of right dorsal cyst of wrist Device: GE Logiq Q7 Ultrasound guided injection is preferred based studies that show increased duration, increased effect, greater accuracy, decreased procedural pain, increased response rate, and decreased cost with ultrasound guided versus blind injection.  Verbal informed consent obtained.  Time-out conducted.  Noted no overlying erythema, induration, or other signs of local infection.  Skin prepped in a sterile fashion.  Local anesthesia: Topical Ethyl chloride.  With sterile technique and under real time ultrasound guidance: With a 25-gauge half inch needle injected with 0.5 cc of 0.5% Marcaine  and 0.5 cc of Kenalog 40 mg/mL Completed without difficulty  Pain immediately resolved suggesting accurate placement of the medication.  Advised to call if fevers/chills,  erythema, induration, drainage, or persistent bleeding.  Images 8 Impression: Technically successful ultrasound guided injection.    Impression and Recommendations:    The above documentation has been reviewed and is accurate and complete Kween Bacorn M Monta Maiorana, DO

## 2024-09-14 ENCOUNTER — Other Ambulatory Visit: Payer: Self-pay

## 2024-09-14 ENCOUNTER — Ambulatory Visit (INDEPENDENT_AMBULATORY_CARE_PROVIDER_SITE_OTHER): Admitting: Family Medicine

## 2024-09-14 VITALS — BP 116/72 | HR 91 | Ht 76.0 in

## 2024-09-14 DIAGNOSIS — M5416 Radiculopathy, lumbar region: Secondary | ICD-10-CM

## 2024-09-14 DIAGNOSIS — M25531 Pain in right wrist: Secondary | ICD-10-CM

## 2024-09-14 DIAGNOSIS — M71331 Other bursal cyst, right wrist: Secondary | ICD-10-CM

## 2024-09-14 NOTE — Assessment & Plan Note (Signed)
 Stable recommended.  Will also consider further evaluation of this and next follow-up in 6 to 8 weeks.

## 2024-09-14 NOTE — Assessment & Plan Note (Signed)
 Attempted injection.  No new intestinal signs of infectious etiology.  Very hopeful because of where patient had the discomfort.  Patient was known immediately and was able to put weight with full dorsiflexion of the wrist which he has not been able to do previously.  Discussed with patient about icing regimen and home exercises otherwise.  Increase activity slowly.  Will follow-up with me again in 6 to 8 weeks if necessary.

## 2024-09-14 NOTE — Patient Instructions (Addendum)
 Injected wrist today Tried to pop cyst Watch for redness See me in 6-8 weeks

## 2024-10-24 ENCOUNTER — Encounter: Payer: Self-pay | Admitting: Radiology

## 2024-10-25 NOTE — Progress Notes (Unsigned)
 [facility] [address] [place] [address] Phone: [phone] Subjective:   ISusannah Gully, am serving as a scribe for Dr. Arthea Claudene.  I'm seeing this patient by the request  of:  Amon Aloysius BRAVO, MD  CC: Right wrist pain and back pain.  YEP:Dlagzrupcz  09/14/2024 Stable recommended.  Will also consider further evaluation of this and next follow-up in 6 to 8 weeks.     Attempted injection.  No new intestinal signs of infectious etiology.  Very hopeful because of where patient had the discomfort.  Patient was known immediately and was able to put weight with full dorsiflexion of the wrist which he has not been able to do previously.  Discussed with patient about icing regimen and home exercises otherwise.  Increase activity slowly.  Will follow-up with me again in 6 to 8 weeks if necessary.     Updated 10/26/2024 Wayne Hart is a 43 y.o. male coming in with complaint of back and wrist pain. Back is doing okay. Moving in the right direction with wrist pain. No new symptoms.doing ok        Past Medical History:  Diagnosis Date   Allergy    seasonal   Past Surgical History:  Procedure Laterality Date   KNEE ARTHROSCOPY WITH EXCISION PLICA Right 08/06/2018   Procedure: KNEE ARTHROSCOPY WITH EXCISION PLICA;  Surgeon: Jerri Kay HERO, MD;  Location: [facility];  Service: Orthopedics;  Laterality: Right;   KNEE ARTHROSCOPY WITH MEDIAL MENISECTOMY Right 08/06/2018   Procedure: RIGHT KNEE ARTHROSCOPY WITH PARTIAL MEDIAL MENISCECTOMY;  Surgeon: Jerri Kay HERO, MD;  Location: [facility];  Service: Orthopedics;  Laterality: Right;  injection of tennis elbow right 1cc marcaine  .25 1cc depomedrol 40mg  and lidocaine  1 % 1cc  at 1230   ORIF CLAVICLE FRACTURE Right 03/02/2009   VASECTOMY  10/2015   Social History   Socioeconomic History   Marital status: Married    Spouse name: Not on file   Number of children: 2   Years of  education: Not on file   Highest education level: Bachelor's degree (e.g., BA, AB, BS)  Occupational History   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banker  Tobacco Use   Smoking status: Some Days    Types: Cigars   Smokeless tobacco: Never   Tobacco comments:    occ cigar  Vaping Use   Vaping status: Never Used  Substance and Sexual Activity   Alcohol use: Yes    Comment: occasionally   Drug use: Never   Sexual activity: Not on file  Other Topics Concern   Not on file  Social History Narrative   2016 boy    2014 girl    Social Drivers of Corporate Investment Banker Strain: Low Risk  (08/09/2024)   Overall Financial Resource Strain (CARDIA)    Difficulty of Paying Living Expenses: Not hard at all  Food Insecurity: No Food Insecurity (08/09/2024)   Hunger Vital Sign    Worried About Running Out of Food in the Last Year: Never true    Ran Out of Food in the Last Year: Never true  Transportation Needs: No Transportation Needs (08/09/2024)   PRAPARE - Administrator, Civil Service (Medical): No    Lack of Transportation (Non-Medical): No  Physical Activity: Sufficiently Active (08/09/2024)   Exercise Vital Sign    Days of Exercise per Week: 5 days    Minutes of Exercise per Session:  60 min  Stress: No Stress Concern Present (08/09/2024)   [employer] of Occupational Health - Occupational Stress Questionnaire    Feeling of Stress: Only a little  Social Connections: Socially Integrated (08/09/2024)   Social Connection and Isolation Panel    Frequency of Communication with Friends and Family: More than three times a week    Frequency of Social Gatherings with Friends and Family: Twice a week    Attends Religious Services: More than 4 times per year    Active Member of [employer] or Organizations: Yes    Attends Banker Meetings: 1 to 4 times per year    Marital Status: Married   No Known Allergies Family History  Problem Relation Age of Onset    Myelodysplastic syndrome Father 51   Diabetes Maternal Grandmother    Prostate cancer Neg Hx    Colon cancer Neg Hx    CAD Neg Hx    No current outpatient medications on file.   Reviewed prior external information including notes and imaging from  primary care provider As well as notes that were available from care everywhere and other healthcare systems.  Past medical history, social, surgical and family history all reviewed in electronic medical record.  No pertanent information unless stated regarding to the chief complaint.   Review of Systems:  No headache, visual changes, nausea, vomiting, diarrhea, constipation, dizziness, abdominal pain, skin rash, fevers, chills, night sweats, weight loss, swollen lymph nodes, body aches, joint swelling, chest pain, shortness of breath, mood changes. POSITIVE muscle aches  Objective  Blood pressure 118/76, pulse 70, height 6' 4 (1.93 m), weight 290 lb (131.5 kg), SpO2 99%.   General: No apparent distress alert and oriented x3 mood and affect normal, dressed appropriately.  HEENT: Pupils equal, extraocular movements intact  Respiratory: Patient's speak in full sentences and does not appear short of breath  Cardiovascular: No lower extremity edema, non tender, no erythema  Wrist exam shows good range of motion, nontender.  He has made significant progress.  Low back exam shows does have tightness noted on the paraspinal musculature on the right side.  Seems to be worse on the left side.  More tightness than previous exam.  Osteopathic findings C2 flexed rotated and side bent right C4 flexed rotated and side bent left C6 flexed rotated and side bent left T3 extended rotated and side bent right inhaled third rib T9 extended rotated and side bent left L2 flexed rotated and side bent right L3 flexed rotated and side bent right Sacrum right on right     Impression and Recommendations:    Lumbar radiculopathy Doing much better at this  time.  Discussed icing regimen and home exercises, discussed which activities to do and which ones to avoid.  Increase activity slowly.  Discussed icing regimen.  Increase activity slowly follow-up again in 6 to 12 weeks.     Decision today to treat with OMT was based on Physical Exam  After verbal consent patient was treated with HVLA, ME, FPR techniques in cervical, thoracic, rib, lumbar and sacral areas, all areas are chronic   Patient tolerated the procedure well with improvement in symptoms  Patient given exercises, stretches and lifestyle modifications  See medications in patient instructions if given  Patient will follow up in 4-8 weeks  The above documentation has been reviewed and is accurate and complete Levie Wages M Ardys [place]way, DO

## 2024-10-26 ENCOUNTER — Ambulatory Visit: Admitting: Family Medicine

## 2024-10-26 ENCOUNTER — Ambulatory Visit (INDEPENDENT_AMBULATORY_CARE_PROVIDER_SITE_OTHER): Admitting: Family Medicine

## 2024-10-26 VITALS — BP 118/76 | HR 70 | Ht 76.0 in | Wt 290.0 lb

## 2024-10-26 DIAGNOSIS — M9902 Segmental and somatic dysfunction of thoracic region: Secondary | ICD-10-CM | POA: Diagnosis not present

## 2024-10-26 DIAGNOSIS — M9903 Segmental and somatic dysfunction of lumbar region: Secondary | ICD-10-CM | POA: Diagnosis not present

## 2024-10-26 DIAGNOSIS — M9908 Segmental and somatic dysfunction of rib cage: Secondary | ICD-10-CM

## 2024-10-26 DIAGNOSIS — M5416 Radiculopathy, lumbar region: Secondary | ICD-10-CM

## 2024-10-26 DIAGNOSIS — M9901 Segmental and somatic dysfunction of cervical region: Secondary | ICD-10-CM

## 2024-10-26 DIAGNOSIS — M9904 Segmental and somatic dysfunction of sacral region: Secondary | ICD-10-CM | POA: Diagnosis not present

## 2024-10-26 NOTE — Patient Instructions (Signed)
 Good to see you! Sorry if I roughed you up 3 ibuprofen tonight See you again in 3 month

## 2024-10-27 ENCOUNTER — Encounter: Payer: Self-pay | Admitting: Family Medicine

## 2024-10-27 NOTE — Assessment & Plan Note (Signed)
 Doing much better at this time.  Discussed icing regimen and home exercises, discussed which activities to do and which ones to avoid.  Increase activity slowly.  Discussed icing regimen.  Increase activity slowly follow-up again in 6 to 12 weeks.

## 2024-12-27 ENCOUNTER — Other Ambulatory Visit (HOSPITAL_BASED_OUTPATIENT_CLINIC_OR_DEPARTMENT_OTHER): Payer: Self-pay

## 2024-12-27 ENCOUNTER — Ambulatory Visit: Admitting: Family Medicine

## 2024-12-27 ENCOUNTER — Encounter: Payer: Self-pay | Admitting: Family Medicine

## 2024-12-27 VITALS — BP 112/70 | HR 65 | Ht 76.0 in | Wt 287.0 lb

## 2024-12-27 DIAGNOSIS — M5416 Radiculopathy, lumbar region: Secondary | ICD-10-CM | POA: Diagnosis not present

## 2024-12-27 DIAGNOSIS — M9903 Segmental and somatic dysfunction of lumbar region: Secondary | ICD-10-CM

## 2024-12-27 DIAGNOSIS — M9901 Segmental and somatic dysfunction of cervical region: Secondary | ICD-10-CM

## 2024-12-27 DIAGNOSIS — M9902 Segmental and somatic dysfunction of thoracic region: Secondary | ICD-10-CM | POA: Diagnosis not present

## 2024-12-27 DIAGNOSIS — M9904 Segmental and somatic dysfunction of sacral region: Secondary | ICD-10-CM | POA: Diagnosis not present

## 2024-12-27 DIAGNOSIS — M9908 Segmental and somatic dysfunction of rib cage: Secondary | ICD-10-CM

## 2024-12-27 MED ORDER — CELECOXIB 200 MG PO CAPS
200.0000 mg | ORAL_CAPSULE | Freq: Two times a day (BID) | ORAL | 3 refills | Status: AC
  Filled 2024-12-27: qty 60, 30d supply, fill #0

## 2024-12-27 MED ORDER — PREDNISONE 20 MG PO TABS
20.0000 mg | ORAL_TABLET | Freq: Two times a day (BID) | ORAL | 0 refills | Status: AC
  Filled 2024-12-27: qty 10, 5d supply, fill #0

## 2024-12-27 NOTE — Progress Notes (Signed)
" °  [facility] [address] [place] [address] Phone: [phone] Subjective:   LILLETTE Claretha Schimke am a scribe for Dr. Claudene.   I'm seeing this patient by the request  of:  Amon Aloysius BRAVO, MD  CC: Low back pain  YEP:Dlagzrupcz  NALIN MAZZOCCO is a 44 y.o. male coming in with complaint of back and neck pain Patient states that the back is not very good today. Neck is ok today.  Significant tightness noted as well.  Patient seems to be more of the low back.  Does not remember any true injury.  No radicular symptoms but significant tightness  Medications patient has been prescribed:   Taking:       Reviewed prior external information including notes and imaging from previsou exam, outside providers and external EMR if available.   As well as notes that were available from care everywhere and other healthcare systems.  Past medical history, social, surgical and family history all reviewed in electronic medical record.  No pertanent information unless stated regarding to the chief complaint.   Past Medical History:  Diagnosis Date   Allergy    seasonal    Allergies[1]   Review of Systems:  No headache, visual changes, nausea, vomiting, diarrhea, constipation, dizziness, abdominal pain, skin rash, fevers, chills, night sweats, weight loss, swollen lymph nodes, body aches, joint swelling, chest pain, shortness of breath, mood changes. POSITIVE muscle aches  Objective  Blood pressure 112/70, pulse 65, height 6' 4 (1.93 m), weight 287 lb (130.2 kg), SpO2 98%.   General: No apparent distress alert and oriented x3 mood and affect normal, dressed appropriately.  HEENT: Pupils equal, extraocular movements intact  Respiratory: Patient's speak in full sentences and does not appear short of breath  Cardiovascular: No lower extremity edema, non tender, no erythema  Gait relatively normal MSK:  Back significant loss of lordosis.  Tenderness to palpation  to diffusely.  Tightness noted in right greater than left.  Osteopathic findings  C2 flexed rotated and side bent right C6 flexed rotated and side bent left T3 extended rotated and side bent right inhaled rib T9 extended rotated and side bent left L2 flexed rotated and side bent right L3 flexed rotated and side bent left L5 flexed rotated and side bent left Sacrum right on right     Assessment and Plan:  Lumbar radiculopathy Chronic problem with worsening symptoms.  Celebrex  200 mg twice a day prescribed.  Given prednisone  with patient traveling in the near future.  Discussed icing regimen and home exercises, increase activity slowly.  Follow-up again 6 to 12 weeks.    Nonallopathic problems  Decision today to treat with OMT was based on Physical Exam  After verbal consent patient was treated with HVLA, ME, FPR techniques in cervical, rib, thoracic, lumbar, and sacral  areas  Patient tolerated the procedure well with improvement in symptoms  Patient given exercises, stretches and lifestyle modifications  See medications in patient instructions if given  Patient will follow up in 4-8 weeks     The above documentation has been reviewed and is accurate and complete Vetra Shinall M Davieon Stockham, DO         Note: This dictation was prepared with Dragon dictation along with smaller phrase technology. Any transcriptional errors that result from this process are unintentional.            [1] No Known Allergies  "

## 2024-12-27 NOTE — Assessment & Plan Note (Signed)
 Chronic problem with worsening symptoms.  Celebrex  200 mg twice a day prescribed.  Given prednisone  with patient traveling in the near future.  Discussed icing regimen and home exercises, increase activity slowly.  Follow-up again 6 to 12 weeks.

## 2024-12-27 NOTE — Patient Instructions (Signed)
 Good to see you. Celebrex  200 mg Prednisone  40 for 5 days. See me again in 6 to 7 weeks.

## 2025-01-03 ENCOUNTER — Other Ambulatory Visit (HOSPITAL_BASED_OUTPATIENT_CLINIC_OR_DEPARTMENT_OTHER): Payer: Self-pay

## 2025-01-03 ENCOUNTER — Telehealth: Admitting: Family Medicine

## 2025-01-03 ENCOUNTER — Ambulatory Visit: Admitting: Family Medicine

## 2025-01-03 DIAGNOSIS — H6991 Unspecified Eustachian tube disorder, right ear: Secondary | ICD-10-CM | POA: Diagnosis not present

## 2025-01-03 MED ORDER — FLUTICASONE PROPIONATE 50 MCG/ACT NA SUSP
2.0000 | Freq: Every day | NASAL | 0 refills | Status: AC
  Filled 2025-01-03: qty 16, 30d supply, fill #0

## 2025-01-03 MED ORDER — PREDNISONE 20 MG PO TABS
20.0000 mg | ORAL_TABLET | Freq: Every day | ORAL | 0 refills | Status: AC
  Filled 2025-01-03: qty 5, 5d supply, fill #0

## 2025-01-03 NOTE — Addendum Note (Signed)
 Addended by: VIVIENNE FORTUNATO HERO on: 01/03/2025 11:02 AM   Modules accepted: Orders

## 2025-01-03 NOTE — Progress Notes (Signed)

## 2025-02-10 ENCOUNTER — Ambulatory Visit: Admitting: Family Medicine

## 2025-08-11 ENCOUNTER — Encounter: Admitting: Internal Medicine
